# Patient Record
Sex: Female | Born: 1990
Health system: Southern US, Community
[De-identification: ages and names within clinical notes are randomized; demographics above are authoritative.]

## PROBLEM LIST (undated history)

## (undated) DIAGNOSIS — L309 Dermatitis, unspecified: Secondary | ICD-10-CM

## (undated) DIAGNOSIS — F419 Anxiety disorder, unspecified: Secondary | ICD-10-CM

## (undated) DIAGNOSIS — J309 Allergic rhinitis, unspecified: Secondary | ICD-10-CM

## (undated) DIAGNOSIS — F32A Depression, unspecified: Secondary | ICD-10-CM

## (undated) DIAGNOSIS — F329 Major depressive disorder, single episode, unspecified: Secondary | ICD-10-CM

## (undated) HISTORY — DX: Depression, unspecified: F32.A

## (undated) HISTORY — DX: Allergic rhinitis, unspecified: J30.9

## (undated) HISTORY — PX: TONSILLECTOMY AND ADENOIDECTOMY: SUR1326

## (undated) HISTORY — DX: Dermatitis, unspecified: L30.9

## (undated) HISTORY — DX: Anxiety disorder, unspecified: F41.9

## (undated) HISTORY — DX: Major depressive disorder, single episode, unspecified: F32.9

---
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
## 2013-05-25 ENCOUNTER — Emergency Department (HOSPITAL_COMMUNITY)
Admission: EM | Admit: 2013-05-25 | Discharge: 2013-05-25 | Disposition: A | Discharge status: Home / Self Care | Payer: No Typology Code available for payment source | Attending: Emergency Medicine | Admitting: Emergency Medicine

## 2013-05-25 ENCOUNTER — Emergency Department (HOSPITAL_COMMUNITY): Payer: No Typology Code available for payment source

## 2013-05-25 ENCOUNTER — Encounter (HOSPITAL_COMMUNITY): Payer: Self-pay | Admitting: Emergency Medicine

## 2013-05-25 DIAGNOSIS — Z3202 Encounter for pregnancy test, result negative: Secondary | ICD-10-CM | POA: Insufficient documentation

## 2013-05-25 DIAGNOSIS — Y92410 Unspecified street and highway as the place of occurrence of the external cause: Secondary | ICD-10-CM | POA: Insufficient documentation

## 2013-05-25 DIAGNOSIS — S0993XA Unspecified injury of face, initial encounter: Secondary | ICD-10-CM | POA: Insufficient documentation

## 2013-05-25 DIAGNOSIS — Z79899 Other long term (current) drug therapy: Secondary | ICD-10-CM | POA: Insufficient documentation

## 2013-05-25 DIAGNOSIS — Y9389 Activity, other specified: Secondary | ICD-10-CM | POA: Insufficient documentation

## 2013-05-25 DIAGNOSIS — IMO0002 Reserved for concepts with insufficient information to code with codable children: Secondary | ICD-10-CM | POA: Insufficient documentation

## 2013-05-25 DIAGNOSIS — S0990XA Unspecified injury of head, initial encounter: Secondary | ICD-10-CM | POA: Insufficient documentation

## 2013-05-25 LAB — POCT PREGNANCY, URINE: Preg Test, Ur: NEGATIVE

## 2013-05-25 MED ORDER — ONDANSETRON 4 MG PO TBDP
8.0000 mg | ORAL_TABLET | Freq: Once | ORAL | Status: AC
  Administered 2013-05-25: 8 mg via ORAL
  Filled 2013-05-25: qty 2

## 2013-05-25 MED ORDER — DIAZEPAM 5 MG PO TABS: 5.0000 mg | ORAL_TABLET | Freq: Two times a day (BID) | ORAL | Status: AC

## 2013-05-25 MED ORDER — HYDROCODONE-ACETAMINOPHEN 5-325 MG PO TABS: 1.0000 | ORAL_TABLET | Freq: Four times a day (QID) | ORAL | Status: DC | PRN

## 2013-05-25 MED ORDER — HYDROMORPHONE HCL PF 1 MG/ML IJ SOLN
1.0000 mg | Freq: Once | INTRAMUSCULAR | Status: AC
  Administered 2013-05-25: 1 mg via INTRAMUSCULAR
  Filled 2013-05-25: qty 1

## 2013-05-25 MED ORDER — CYCLOBENZAPRINE HCL 10 MG PO TABS
5.0000 mg | ORAL_TABLET | Freq: Once | ORAL | Status: AC
  Administered 2013-05-25: 5 mg via ORAL
  Filled 2013-05-25: qty 1

## 2013-05-25 MED ORDER — OXYCODONE-ACETAMINOPHEN 5-325 MG PO TABS
2.0000 | ORAL_TABLET | Freq: Once | ORAL | Status: AC
  Administered 2013-05-25: 2 via ORAL
  Filled 2013-05-25: qty 2
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
## 2013-05-25 NOTE — ED Notes (Signed)
Patient transported to X-ray 
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
## 2013-05-25 NOTE — ED Provider Notes (Signed)
5:19 PM Patient's CT scans were unremarkable. On repeat exam the patient is awake alert, speaking clearly, and in no distress.  I discussed return precautions, follow up instructions, pain management instructions with the patient.  Gerhard Munch, MD 05/25/13 [phone]

## 2013-05-25 NOTE — ED Notes (Signed)
Pt notified boyfriend

## 2013-11-22 ENCOUNTER — Encounter: Payer: Self-pay | Admitting: Obstetrics and Gynecology

## 2014-01-07 ENCOUNTER — Encounter: Payer: Self-pay | Admitting: Obstetrics and Gynecology

## 2014-01-07 ENCOUNTER — Telehealth: Payer: Self-pay | Admitting: Obstetrics and Gynecology

## 2014-01-07 NOTE — Telephone Encounter (Signed)
Okay thanks. You can close encounter.

## 2014-01-07 NOTE — Telephone Encounter (Signed)
Thanks for the update.  Patient can be seen only if she pays the [facility] no show fee for a new patient.

## 2014-01-07 NOTE — Telephone Encounter (Signed)
I called and confirmed appt with patient on 12/30/13. But patient DNKA this morning. I discharged patient.

## 2014-02-09 ENCOUNTER — Encounter: Payer: Self-pay | Admitting: Obstetrics and Gynecology

## 2014-12-26 ENCOUNTER — Ambulatory Visit: Payer: Self-pay | Admitting: Surgery

## 2014-12-26 NOTE — H&P (Signed)
History of Present Illness Maria Pennington. Caden Fatica MD; 12/26/2014 1:21 PM) Patient words: lipoma abd, LUQ.  The patient is a 24 year old female who presents with a complaint of Mass. Referred by Dr. Gust Brooms for evaluation of left chest wall mass.  This is a healthy 24 year old female who presents with several months of an enlarging mass below her left breast. This has become fairly prominent and asymmetric compared to the right. There is some mild discomfort associated with this area. There is no sign of any infection or inflammation in this area. She has not had any imaging of this mass. She is now referred for surgical evaluation for excision.   Other Problems (Ammie Eversole, LPN; 0/45/4098 11:91 AM) Anxiety Disorder Depression Migraine Headache  Past Surgical History (Ammie Eversole, LPN; 4/78/2956 21:30 AM) Knee Surgery Right. Tonsillectomy  Diagnostic Studies History (Ammie Eversole, LPN; 8/65/7846 96:29 AM) Colonoscopy never Mammogram >3 years ago Pap Smear 1-5 years ago  Allergies (Ammie Eversole, LPN; 02/11/4131 44:01 AM) No Known Drug Allergies04/07/2015  Medication History (Ammie Eversole, LPN; 0/27/2536 64:40 AM) Topiramate (  Tablet, Oral) Active. Phentermine HCl (  Capsule, Oral) Active. Relpax (  Tablet, Oral) Active.  Social History (Ammie Eversole, LPN; 3/47/4259 56:38 AM) Alcohol use Occasional alcohol use. Caffeine use Tea. No drug use Tobacco use Never smoker.  Family History Deon Pilling, LPN; 7/56/4332 95:18 AM) Depression Mother. Hypertension Mother. Migraine Headache Mother.  Pregnancy / Birth History Deon Pilling, LPN; 8/41/6606 30:16 AM) Age at menarche 17 years. Contraceptive History Intrauterine device. Gravida 0 Irregular periods Para 0  Review of Systems (Ammie Eversole LPN; 0/06/9322 55:73 AM) General Present- Weight Gain. Not Present- Appetite Loss, Chills, Fatigue, Fever, Night Sweats and Weight  Loss. Skin Not Present- Change in Wart/Mole, Dryness, Hives, Jaundice, New Lesions, Non-Healing Wounds, Rash and Ulcer. HEENT Present- Seasonal Allergies. Not Present- Earache, Hearing Loss, Hoarseness, Nose Bleed, Oral Ulcers, Ringing in the Ears, Sinus Pain, Sore Throat, Visual Disturbances, Wears glasses/contact lenses and Yellow Eyes. Respiratory Not Present- Bloody sputum, Chronic Cough, Difficulty Breathing, Snoring and Wheezing. Breast Present- Breast Pain. Not Present- Breast Mass, Nipple Discharge and Skin Changes. Cardiovascular Present- Swelling of Extremities. Not Present- Chest Pain, Difficulty Breathing Lying Down, Leg Cramps, Palpitations, Rapid Heart Rate and Shortness of Breath. Gastrointestinal Present- Constipation and Indigestion. Not Present- Abdominal Pain, Bloating, Bloody Stool, Change in Bowel Habits, Chronic diarrhea, Difficulty Swallowing, Excessive gas, Gets full quickly at meals, Hemorrhoids, Nausea, Rectal Pain and Vomiting. Female Genitourinary Not Present- Frequency, Nocturia, Painful Urination, Pelvic Pain and Urgency. Musculoskeletal Present- Back Pain. Not Present- Joint Pain, Joint Stiffness, Muscle Pain, Muscle Weakness and Swelling of Extremities. Neurological Present- Tingling. Not Present- Decreased Memory, Fainting, Headaches, Numbness, Seizures, Tremor, Trouble walking and Weakness. Psychiatric Not Present- Anxiety, Bipolar, Change in Sleep Pattern, Depression, Fearful and Frequent crying. Endocrine Not Present- Cold Intolerance, Excessive Hunger, Hair Changes, Heat Intolerance, Hot flashes and New Diabetes. Hematology Not Present- Easy Bruising, Excessive bleeding, Gland problems, HIV and Persistent Infections.   Vitals (Ammie Eversole LPN; 11/05/2540 70:62 AM) 12/26/2014 11:05 AM Weight: 243.8 lb Height: 68in Body Surface Area: 2.3 m Body Mass Index: 37.07 kg/m Temp.: 98.39F(Oral)  Pulse: 90 (Regular)  BP: 140/86 (Sitting, Left Arm,  Standard)    Physical Exam Molli Hazard K. Bev Drennen MD; 12/26/2014 1:22 PM) The physical exam findings are as follows: Note:WDWN in NAD Left chest wall below breast - 10 x 15 oval-shaped well-demarcated subcutaneous mass; soft, mildly tender; no sign of infection    Assessment & Plan Molli Hazard K.  Ted Leonhart MD; 12/26/2014 11:31 AM) LIPOMA OF ANTERIOR CHEST WALL (214.8  D17.1) Current Plans  Schedule for Surgery - excision of subcutaneous lipoma - left chest wall (10 x 15 cm). The surgical procedure has been discussed with the patient. Potential risks, benefits, alternative treatments, and expected outcomes have been explained. All of the patient's questions at this time have been answered. The likelihood of reaching the patient's treatment goal is good. The patient understand the proposed surgical procedure and wishes to proceed.    Maria ArmsMatthew K. Corliss Skainssuei, MD, [facility]  General/ Trauma Surgery  12/26/2014 1:23 PM

## 2015-08-16 ENCOUNTER — Telehealth: Payer: Self-pay

## 2015-08-16 NOTE — Telephone Encounter (Signed)
Pt called in today: c/o of stress due to work related issues. Stress from home life is causing some sx of nervous twitch of eye, worry and loss of sleep.  Please advise?

## 2015-08-16 NOTE — Telephone Encounter (Signed)
Called pt back and confirmed that there are no Suicidal or Homicidal ideations.  Confirmed pt has received her sample.

## 2015-08-16 NOTE — Telephone Encounter (Signed)
She has no SI or HI ideations, right? Will start Viibryd, sample pak was given

## 2015-10-10 LAB — LIPID PANEL
Cholesterol: 132 mg/dL (ref 0–200)
HDL: 49 mg/dL (ref 35–70)
LDL Cholesterol: 71 mg/dL
TRIGLYCERIDES: 61 mg/dL (ref 40–160)

## 2015-10-10 LAB — HM PAP SMEAR

## 2015-10-10 LAB — TSH: TSH: 0.51 u[IU]/mL (ref 0.41–5.90)

## 2015-10-23 ENCOUNTER — Telehealth: Payer: Self-pay | Admitting: Geriatric Medicine

## 2015-10-23 ENCOUNTER — Other Ambulatory Visit: Payer: Self-pay | Admitting: Internal Medicine

## 2015-10-23 DIAGNOSIS — R8290 Unspecified abnormal findings in urine: Secondary | ICD-10-CM

## 2015-10-23 DIAGNOSIS — B9689 Other specified bacterial agents as the cause of diseases classified elsewhere: Secondary | ICD-10-CM | POA: Insufficient documentation

## 2015-10-23 DIAGNOSIS — N760 Acute vaginitis: Secondary | ICD-10-CM

## 2015-10-23 LAB — POCT URINALYSIS DIPSTICK
Bilirubin, UA: NEGATIVE
Blood, UA: NEGATIVE
GLUCOSE UA: NEGATIVE
Ketones, UA: NEGATIVE
LEUKOCYTES UA: NEGATIVE
NITRITE UA: NEGATIVE
Protein, UA: 15
Spec Grav, UA: >=1.03
UROBILINOGEN UA: 0.2
pH, UA: 6

## 2015-10-23 MED ORDER — METRONIDAZOLE 250 MG PO TABS
250.0000 mg | ORAL_TABLET | Freq: Two times a day (BID) | ORAL | Status: DC
Start: 2015-10-23 — End: 2015-10-24

## 2015-10-23 NOTE — Telephone Encounter (Signed)
Rx sent in

## 2015-10-23 NOTE — Telephone Encounter (Signed)
Pt c/o foul smelling urine, discharge, hx of BV

## 2015-10-24 ENCOUNTER — Other Ambulatory Visit: Payer: Self-pay

## 2015-10-24 DIAGNOSIS — B9689 Other specified bacterial agents as the cause of diseases classified elsewhere: Secondary | ICD-10-CM

## 2015-10-24 DIAGNOSIS — N760 Acute vaginitis: Secondary | ICD-10-CM

## 2015-10-24 MED ORDER — METRONIDAZOLE 250 MG PO TABS: 250.0000 mg | ORAL_TABLET | Freq: Two times a day (BID) | ORAL | Status: AC

## 2015-11-21 ENCOUNTER — Other Ambulatory Visit: Payer: Self-pay | Admitting: Internal Medicine

## 2015-11-21 MED ORDER — FLUCONAZOLE 150 MG PO TABS: 150.0000 mg | ORAL_TABLET | Freq: Once | ORAL | Status: DC

## 2015-12-27 ENCOUNTER — Encounter: Payer: Self-pay | Admitting: Internal Medicine

## 2015-12-27 ENCOUNTER — Other Ambulatory Visit (INDEPENDENT_AMBULATORY_CARE_PROVIDER_SITE_OTHER): Payer: BLUE CROSS/BLUE SHIELD

## 2015-12-27 ENCOUNTER — Ambulatory Visit (INDEPENDENT_AMBULATORY_CARE_PROVIDER_SITE_OTHER): Payer: BLUE CROSS/BLUE SHIELD | Admitting: Internal Medicine

## 2015-12-27 VITALS — BP 124/78 | HR 93 | Temp 98.3°F | Resp 16 | Ht 69.0 in | Wt 213.0 lb

## 2015-12-27 DIAGNOSIS — T753XXA Motion sickness, initial encounter: Secondary | ICD-10-CM | POA: Diagnosis not present

## 2015-12-27 DIAGNOSIS — R946 Abnormal results of thyroid function studies: Secondary | ICD-10-CM | POA: Diagnosis not present

## 2015-12-27 DIAGNOSIS — R238 Other skin changes: Secondary | ICD-10-CM

## 2015-12-27 DIAGNOSIS — J301 Allergic rhinitis due to pollen: Secondary | ICD-10-CM

## 2015-12-27 DIAGNOSIS — R7989 Other specified abnormal findings of blood chemistry: Secondary | ICD-10-CM

## 2015-12-27 DIAGNOSIS — R233 Spontaneous ecchymoses: Secondary | ICD-10-CM

## 2015-12-27 LAB — CBC WITH DIFFERENTIAL/PLATELET
BASOS PCT: 0.3 % (ref 0.0–3.0)
Basophils Absolute: 0 10*3/uL (ref 0.0–0.1)
EOS PCT: 1.8 % (ref 0.0–5.0)
Eosinophils Absolute: 0.2 10*3/uL (ref 0.0–0.7)
HCT: 39.9 % (ref 36.0–46.0)
Hemoglobin: 13.4 g/dL (ref 12.0–15.0)
LYMPHS ABS: 2.5 10*3/uL (ref 0.7–4.0)
Lymphocytes Relative: 26.6 % (ref 12.0–46.0)
MCHC: 33.7 g/dL (ref 30.0–36.0)
MCV: 86.4 fl (ref 78.0–100.0)
MONO ABS: 0.7 10*3/uL (ref 0.1–1.0)
Monocytes Relative: 7.2 % (ref 3.0–12.0)
NEUTROS ABS: 6.1 10*3/uL (ref 1.4–7.7)
NEUTROS PCT: 64.1 % (ref 43.0–77.0)
PLATELETS: 285 10*3/uL (ref 150.0–400.0)
RBC: 4.62 Mil/uL (ref 3.87–5.11)
RDW: 13.4 % (ref 11.5–15.5)
WBC: 9.5 10*3/uL (ref 4.0–10.5)

## 2015-12-27 LAB — APTT: aPTT: 29.5 s (ref 23.4–32.7)

## 2015-12-27 LAB — COMPREHENSIVE METABOLIC PANEL
ALBUMIN: 4.5 g/dL (ref 3.5–5.2)
ALT: 11 U/L (ref 0–35)
AST: 14 U/L (ref 0–37)
Alkaline Phosphatase: 39 U/L (ref 39–117)
BUN: 15 mg/dL (ref 6–23)
CHLORIDE: 108 meq/L (ref 96–112)
CO2: 20 mEq/L (ref 19–32)
Calcium: 9.6 mg/dL (ref 8.4–10.5)
Creatinine, Ser: 0.74 mg/dL (ref 0.40–1.20)
GFR: 102.68 mL/min (ref >60.00)
Glucose, Bld: 83 mg/dL (ref 70–99)
Potassium: 3.5 mEq/L (ref 3.5–5.1)
SODIUM: 136 meq/L (ref 135–145)
TOTAL PROTEIN: 7.5 g/dL (ref 6.0–8.3)
Total Bilirubin: 0.4 mg/dL (ref 0.2–1.2)

## 2015-12-27 LAB — T4, FREE: FREE T4: 0.9 ng/dL (ref 0.60–1.60)

## 2015-12-27 LAB — PROTIME-INR
INR: 1.2 ratio — AB (ref 0.8–1.0)
Prothrombin Time: 12.7 s (ref 9.6–13.1)

## 2015-12-27 LAB — TSH: TSH: 1.53 u[IU]/mL (ref 0.35–4.50)

## 2015-12-27 MED ORDER — MOMETASONE FUROATE 50 MCG/ACT NA SUSP: 2.0000 | Freq: Every day | NASAL | Status: DC

## 2015-12-27 MED ORDER — LEVOCETIRIZINE DIHYDROCHLORIDE 5 MG PO TABS: 5.0000 mg | ORAL_TABLET | Freq: Every evening | ORAL | Status: DC

## 2015-12-27 MED ORDER — SCOPOLAMINE 1 MG/3DAYS TD PT72: 1.0000 | MEDICATED_PATCH | TRANSDERMAL | Status: DC

## 2015-12-27 NOTE — Progress Notes (Signed)
Pre visit review using our clinic review tool, if applicable. No additional management support is needed unless otherwise documented below in the visit note. 

## 2015-12-27 NOTE — Patient Instructions (Signed)
Allergic Rhinitis Allergic rhinitis is when the mucous membranes in the nose respond to allergens. Allergens are particles in the air that cause your body to have an allergic reaction. This causes you to release allergic antibodies. Through a chain of events, these eventually cause you to release histamine into the blood stream. Although meant to protect the body, it is this release of histamine that causes your discomfort, such as frequent sneezing, congestion, and an itchy, runny nose.  CAUSES Seasonal allergic rhinitis (hay fever) is caused by pollen allergens that may come from grasses, trees, and weeds. Year-round allergic rhinitis (perennial allergic rhinitis) is caused by allergens such as house dust mites, pet dander, and mold spores. SYMPTOMS  Nasal stuffiness (congestion).  Itchy, runny nose with sneezing and tearing of the eyes. DIAGNOSIS Your health care provider can help you determine the allergen or allergens that trigger your symptoms. If you and your health care provider are unable to determine the allergen, skin or blood testing may be used. Your health care provider will diagnose your condition after taking your health history and performing a physical exam. Your health care provider may assess you for other related conditions, such as asthma, pink eye, or an ear infection. TREATMENT Allergic rhinitis does not have a cure, but it can be controlled by:  Medicines that block allergy symptoms. These may include allergy shots, nasal sprays, and oral antihistamines.  Avoiding the allergen. Hay fever may often be treated with antihistamines in pill or nasal spray forms. Antihistamines block the effects of histamine. There are over-the-counter medicines that may help with nasal congestion and swelling around the eyes. Check with your health care provider before taking or giving this medicine. If avoiding the allergen or the medicine prescribed do not work, there are many new medicines  your health care provider can prescribe. Stronger medicine may be used if initial measures are ineffective. Desensitizing injections can be used if medicine and avoidance does not work. Desensitization is when a patient is given ongoing shots until the body becomes less sensitive to the allergen. Make sure you follow up with your health care provider if problems continue. HOME CARE INSTRUCTIONS It is not possible to completely avoid allergens, but you can reduce your symptoms by taking steps to limit your exposure to them. It helps to know exactly what you are allergic to so that you can avoid your specific triggers. SEEK MEDICAL CARE IF:  You have a fever.  You develop a cough that does not stop easily (persistent).  You have shortness of breath.  You start wheezing.  Symptoms interfere with normal daily activities.   This information is not intended to replace advice given to you by your health care provider. Make sure you discuss any questions you have with your health care provider.   Document Released: 05/28/2001 Document Revised: 09/23/2014 Document Reviewed: 05/10/2013 Elsevier Interactive Patient Education 2016 Elsevier Inc.  

## 2015-12-28 ENCOUNTER — Other Ambulatory Visit: Payer: Self-pay

## 2015-12-28 ENCOUNTER — Encounter: Payer: Self-pay | Admitting: Internal Medicine

## 2015-12-28 LAB — T3: T3, Total: 100 ng/dL (ref 76–181)

## 2015-12-28 LAB — THYROID PEROXIDASE ANTIBODY: Thyroperoxidase Ab SerPl-aCnc: 11 IU/mL — ABNORMAL HIGH (ref <9)

## 2015-12-28 MED ORDER — FLUTICASONE PROPIONATE 50 MCG/ACT NA SUSP: 2.0000 | Freq: Every day | NASAL | Status: DC

## 2015-12-28 MED ORDER — MECLIZINE HCL 12.5 MG PO TABS: 12.5000 mg | ORAL_TABLET | Freq: Three times a day (TID) | ORAL | Status: DC | PRN

## 2015-12-28 NOTE — Telephone Encounter (Signed)
Motion sickness patch was too expensive: can an alternative be sent in? Nasal spray is not covered by insurance but fluticasone is covered: can you send in a 90 day supply?

## 2015-12-31 NOTE — Progress Notes (Signed)
Subjective:  Patient ID: Maria Pennington, female    DOB: 11/20/1990  Age: 25 y.o. MRN: [id number]  CC: Allergic Rhinitis    HPI Maria Pennington presents for several concerns -  She complains that she has noticed a couple of bruises on her right arm and her right lower extremity over the past few weeks. She does not recall any injury that would cause a bruise. She said the bruises last a few days and then spontaneously resolve. She denies nosebleeds, bleeding from her mouth or gums, hematuria, heavy menstrual bleeding, or prior complications from surgical procedures. There are no significant bruises for her to show me today.  She complains that when she travels as a passenger in a car she gets car sick and request a medication for this.  She also complains of springtime nasal allergies with congestion, postnasal drip, and sneezing. She wants a refill on Xyzal and to start a steroid nasal spray.  She has a history of low TSH level that needs to be followed.  History Maria Pennington has no past medical history on file.   She has past surgical history that includes Knee surgery (Right, 2009).   Her family history is not on file.She reports that she has never smoked. She does not have any smokeless tobacco history on file. She reports that she does not drink alcohol or use illicit drugs.  Outpatient Prescriptions Prior to Visit  Medication Sig Dispense Refill  . fluconazole (DIFLUCAN) 150 MG tablet Take 1 tablet (150 mg total) by mouth once. 1 tablet 3   No facility-administered medications prior to visit.    ROS Review of Systems  Constitutional: Negative.  Negative for fever, chills and fatigue.  HENT: Positive for postnasal drip, rhinorrhea and sneezing. Negative for nosebleeds, sinus pressure, sore throat and trouble swallowing.   Eyes: Negative.  Negative for photophobia and visual disturbance.  Respiratory: Negative.  Negative for cough and shortness of breath.   Cardiovascular:  Negative.  Negative for chest pain, palpitations and leg swelling.  Gastrointestinal: Negative.  Negative for nausea, vomiting, abdominal pain, diarrhea, constipation and blood in stool.  Endocrine: Negative.   Genitourinary: Negative.  Negative for hematuria, vaginal bleeding and vaginal discharge.  Musculoskeletal: Negative.  Negative for myalgias, back pain and neck pain.  Skin: Negative.  Negative for color change, pallor and rash.  Allergic/Immunologic: Negative.   Neurological: Negative.   Hematological: Negative for adenopathy. Bruises/bleeds easily.  Psychiatric/Behavioral: Positive for dysphoric mood. Negative for suicidal ideas, hallucinations, behavioral problems, confusion, sleep disturbance, self-injury, decreased concentration and agitation. The patient is nervous/anxious. The patient is not hyperactive.        She is being treated elsewhere for depression and anxiety.    Objective:  BP 124/78 mmHg  Pulse 93  Temp(Src) 98.3 F (36.8 C) (Oral)  Resp 16  Ht 5\' 9"  (1.753 m)  Wt 213 lb (96.616 kg)  BMI 31.44 kg/m2  SpO2 98%  Physical Exam  Constitutional: No distress.  HENT:  Mouth/Throat: Oropharynx is clear and moist. No oropharyngeal exudate.  Eyes: Conjunctivae are normal. Right eye exhibits no discharge. Left eye exhibits no discharge. No scleral icterus.  Neck: Normal range of motion. Neck supple. No JVD present. No tracheal deviation present. No thyromegaly present.  Cardiovascular: Normal rate, regular rhythm, normal heart sounds and intact distal pulses.  Exam reveals no gallop and no friction rub.   No murmur heard. Pulmonary/Chest: Effort normal and breath sounds normal. No stridor. No respiratory distress. She has no wheezes.  She has no rales. She exhibits no tenderness.  Abdominal: Soft. Bowel sounds are normal. She exhibits no distension and no mass. There is no tenderness. There is no rebound and no guarding.  Musculoskeletal: Normal range of motion. She  exhibits no edema or tenderness.  Lymphadenopathy:    She has no cervical adenopathy.  Skin: Skin is warm, dry and intact. No abrasion, no bruising, no ecchymosis, no petechiae and no rash noted. She is not diaphoretic. No erythema. No pallor.  Psychiatric: She has a normal mood and affect. Her behavior is normal. Judgment and thought content normal.  Vitals reviewed.     Assessment & Plan:   Tais was seen today for allergic rhinitis .  Diagnoses and all orders for this visit:  Low TSH level- her thyroid peroxidase antibody is slightly positive, her TFTs are currently normal, I will monitor her thyroid function periodically for the development of hypothyroidism. -     T3; Future -     T4, free; Future -     TSH; Future -     Thyroid peroxidase antibody; Future -     Comprehensive metabolic panel; Future -     CBC with Differential/Platelet; Future  Easy bruising- there is no evidence of bruising or bleeding at this time, her labs are all within normal limits-normal platelet count normal pro time and normal PTT. She will avoid alcohol and NSAIDs and will notify me if she develops any new or worsening symptoms. -     Comprehensive metabolic panel; Future -     CBC with Differential/Platelet; Future -     Protime-INR; Future -     APTT; Future  Allergic rhinitis due to pollen -     levocetirizine (XYZAL) 5 MG tablet; Take 1 tablet (5 mg total) by mouth every evening. -     Discontinue: mometasone (NASONEX) 50 MCG/ACT nasal spray; Place 2 sprays into the nose daily.  Motion sickness, initial encounter -     Discontinue: scopolamine (TRANSDERM-SCOP, 1.5 MG,) 1 MG/3DAYS; Place 1 patch (1.5 mg total) onto the skin every 3 (three) days. -     meclizine (ANTIVERT) 12.5 MG tablet; Take 1 tablet (12.5 mg total) by mouth 3 (three) times daily as needed for dizziness.   I have discontinued Maria Pennington's fluconazole, mometasone, and scopolamine. I am also having her start on  levocetirizine and meclizine. Additionally, I am having her maintain her clonazePAM, FLUoxetine, topiramate, Diclofenac Sodium, and Levonorgestrel.  Meds ordered this encounter  Medications  . clonazePAM (KLONOPIN) 0.5 MG tablet    Sig: TAKE 1 TABLET ONCE DAILY AS NEEDED.    Refill:  0  . FLUoxetine (PROZAC) 10 MG capsule    Sig: Take 10 mg by mouth every morning.    Refill:  0  . topiramate (TOPAMAX) 100 MG tablet    Sig: Take 100 mg by mouth 2 (two) times daily.  . Diclofenac Sodium (PENNSAID) 2 % SOLN    Sig: Place onto the skin.  Marland Kitchen levocetirizine (XYZAL) 5 MG tablet    Sig: Take 1 tablet (5 mg total) by mouth every evening.    Dispense:  30 tablet    Refill:  11  . DISCONTD: mometasone (NASONEX) 50 MCG/ACT nasal spray    Sig: Place 2 sprays into the nose daily.    Dispense:  17 g    Refill:  11  . DISCONTD: scopolamine (TRANSDERM-SCOP, 1.5 MG,) 1 MG/3DAYS    Sig: Place 1 patch (1.5 mg total)  onto the skin every 3 (three) days.    Dispense:  10 patch    Refill:  1  . Levonorgestrel (SKYLA) 13.5 MG IUD    Sig: 1 Act by Intrauterine route once.    Dispense:  1 Intra Uterine Device    Refill:  0  . meclizine (ANTIVERT) 12.5 MG tablet    Sig: Take 1 tablet (12.5 mg total) by mouth 3 (three) times daily as needed for dizziness.    Dispense:  30 tablet    Refill:  1     Follow-up: Return if symptoms worsen or fail to improve.  Sanda Linger, MD

## 2016-01-18 ENCOUNTER — Telehealth: Payer: Self-pay | Admitting: Geriatric Medicine

## 2016-01-18 DIAGNOSIS — N760 Acute vaginitis: Secondary | ICD-10-CM

## 2016-01-18 DIAGNOSIS — B9689 Other specified bacterial agents as the cause of diseases classified elsewhere: Secondary | ICD-10-CM

## 2016-01-18 MED ORDER — METRONIDAZOLE 500 MG PO TABS: 500.0000 mg | ORAL_TABLET | Freq: Two times a day (BID) | ORAL | Status: DC

## 2016-01-18 NOTE — Telephone Encounter (Signed)
Rx sent 

## 2016-01-18 NOTE — Telephone Encounter (Signed)
Patient aware.

## 2016-01-18 NOTE — Telephone Encounter (Signed)
Patient complains of watery discharge with mild ammonia smell. Please advise, thanks. [facility] [address]

## 2016-01-29 ENCOUNTER — Other Ambulatory Visit: Payer: Self-pay | Admitting: Geriatric Medicine

## 2016-01-29 DIAGNOSIS — J301 Allergic rhinitis due to pollen: Secondary | ICD-10-CM

## 2016-01-29 MED ORDER — LEVOCETIRIZINE DIHYDROCHLORIDE 5 MG PO TABS: 5.0000 mg | ORAL_TABLET | Freq: Every evening | ORAL | Status: DC

## 2016-03-12 ENCOUNTER — Other Ambulatory Visit: Payer: Self-pay | Admitting: Family

## 2016-03-12 MED ORDER — SULFAMETHOXAZOLE-TRIMETHOPRIM 800-160 MG PO TABS: 1.0000 | ORAL_TABLET | Freq: Two times a day (BID) | ORAL | Status: DC

## 2016-03-28 ENCOUNTER — Telehealth: Payer: Self-pay | Admitting: Internal Medicine

## 2016-03-28 ENCOUNTER — Other Ambulatory Visit: Payer: Self-pay | Admitting: Internal Medicine

## 2016-03-28 DIAGNOSIS — R1310 Dysphagia, unspecified: Secondary | ICD-10-CM

## 2016-03-28 DIAGNOSIS — K219 Gastro-esophageal reflux disease without esophagitis: Secondary | ICD-10-CM | POA: Insufficient documentation

## 2016-03-28 NOTE — Telephone Encounter (Signed)
Requesting referral to GI.  Has had difficulty swallowing when eating food.  Has hiccups with sore throat as well.  Feels like something is caught in middle of throat. Would like to know if she needs to make OV or if she could just get referral.

## 2016-03-28 NOTE — Telephone Encounter (Signed)
Referral ordered

## 2016-03-29 ENCOUNTER — Telehealth: Payer: Self-pay | Admitting: Gastroenterology

## 2016-03-29 ENCOUNTER — Encounter: Payer: Self-pay | Admitting: Gastroenterology

## 2016-03-29 NOTE — Telephone Encounter (Signed)
Appointment moved to 04/02/16

## 2016-03-29 NOTE — Telephone Encounter (Signed)
Notified patient.  Maria Pennington and Maria Pennington can you make sure this gets sent.  Thanks!

## 2016-03-29 NOTE — Telephone Encounter (Signed)
Referral sent to [facility]

## 2016-03-31 ENCOUNTER — Encounter: Payer: Self-pay | Admitting: Internal Medicine

## 2016-04-02 ENCOUNTER — Encounter: Payer: Self-pay | Admitting: Gastroenterology

## 2016-04-02 ENCOUNTER — Ambulatory Visit (INDEPENDENT_AMBULATORY_CARE_PROVIDER_SITE_OTHER): Payer: BLUE CROSS/BLUE SHIELD | Admitting: Gastroenterology

## 2016-04-02 VITALS — BP 116/68 | HR 72 | Ht 69.0 in | Wt 209.8 lb

## 2016-04-02 DIAGNOSIS — R1310 Dysphagia, unspecified: Secondary | ICD-10-CM | POA: Diagnosis not present

## 2016-04-02 MED ORDER — OMEPRAZOLE 40 MG PO CPDR: 40.0000 mg | DELAYED_RELEASE_CAPSULE | Freq: Every day | ORAL | Status: DC

## 2016-04-02 NOTE — Progress Notes (Signed)
Maria Pennington    [id number]    [birth year]  Primary Care Physician:Maria Yetta Barre, MD  Referring Physician: [name], MD [address] [place], [place] [address]  Chief complaint:  Dysphagia   HPI: 25 year old female here for evaluation of dysphagia to mostly solids for past week and half. She reports her symptoms started all of a sudden after she choked about 10 days ago during a meal, doesn't remember exactly what she was eating but thinks was some kind of meat and since then has had 1 more episode of choking with spaghetti and meatball where she had to regurgitate the food back. She feels food is getting stuck in the back of her throat and at times she can feel the pills hanging up for a little bit longer before they go down. She hasn't had any issue prior to these choking episodes with swallowing. She has only intermittent heartburn and regurgitation and was taking antacids as needed. No significant weight change, denies any weight gain or loss in the past few months. Denies any nausea, vomiting, abdominal pain, melena or bright red blood per rectum. No history of asthma, Atopy or environmental allergy other than seasonal allergy that is better this year because of antihistamines . ROS positive for Mild diarrhea per patient, she had 1 episode of semi-formed stool yesterday and one episode this morning. No fever or chills. Denies eating uncooked raw meat or anything different, no sick contacts.      Outpatient Encounter Prescriptions as of 04/02/2016  Medication Sig  . clonazePAM (KLONOPIN) 0.5 MG tablet TAKE 1 TABLET ONCE DAILY AS NEEDED.  Marland Kitchen Diclofenac Sodium (PENNSAID) 2 % SOLN Place onto the skin.  Marland Kitchen FLUoxetine (PROZAC) 10 MG capsule Take 10 mg by mouth every morning.  Marland Kitchen levocetirizine (XYZAL) 5 MG tablet Take 1 tablet (5 mg total) by mouth every evening.  . Levonorgestrel (SKYLA) 13.5 MG IUD 1 Act by Intrauterine route once.  . topiramate (TOPAMAX) 100 MG  tablet Take 100 mg by mouth 2 (two) times daily.  Marland Kitchen omeprazole (PRILOSEC) 40 MG capsule Take 1 capsule (40 mg total) by mouth daily.  . [DISCONTINUED] fluticasone (FLONASE) 50 MCG/ACT nasal spray Place 2 sprays into both nostrils daily.  . [DISCONTINUED] meclizine (ANTIVERT) 12.5 MG tablet Take 1 tablet (12.5 mg total) by mouth 3 (three) times daily as needed for dizziness.  . [DISCONTINUED] metroNIDAZOLE (FLAGYL) 500 MG tablet Take 1 tablet (500 mg total) by mouth 2 (two) times daily.  . [DISCONTINUED] sulfamethoxazole-trimethoprim (BACTRIM DS,SEPTRA DS) 800-160 MG tablet Take 1 tablet by mouth 2 (two) times daily.   No facility-administered encounter medications on file as of 04/02/2016.    Allergies as of 04/02/2016 - Review Complete 04/02/2016  Allergen Reaction Noted  . Relpax [eletriptan] Palpitations 12/27/2015  . Triptans Palpitations 12/27/2015    Past Medical History  Diagnosis Date  . Anxiety   . Depressed   . Eczema   . Allergic rhinitis     Past Surgical History  Procedure Laterality Date  . Knee surgery Right 2009  . Tonsillectomy and adenoidectomy      Family History  Problem Relation Age of Onset  . Colon cancer Neg Hx   . Colonic polyp Paternal Grandmother   . Colonic polyp Mother   . Heart disease Maternal Grandmother   . Ulcers Father     Social History   Social History  . Marital Status: Single  Spouse Name: N/A  . Number of Children: 0  . Years of Education: N/A   Occupational History  . unemployed    Social History Main Topics  . Smoking status: Never Smoker   . Smokeless tobacco: Not on file  . Alcohol Use: No  . Drug Use: No  . Sexual Activity: Not on file   Other Topics Concern  . Not on file   Social History Narrative      Review of systems: Review of Systems  Constitutional: Negative for fever and chills.  HENT: Negative.   Eyes: Negative for blurred vision.  Respiratory: Negative for cough, shortness of breath and  wheezing.   Cardiovascular: Negative for chest pain and palpitations.  Gastrointestinal: as per HPI Genitourinary: Negative for dysuria, urgency, frequency and hematuria.  Musculoskeletal: Positive for myalgias, negative for back pain and joint pain.  Skin: Negative for itching and rash.  Neurological: Negative for dizziness, tremors, focal weakness, seizures and loss of consciousness.  Endo/Heme/Allergies: Positive for environmental allergies.  Psychiatric/Behavioral: Negative for depression, suicidal ideas and hallucinations.  All other systems reviewed and are negative.   Physical Exam: Filed Vitals:   04/02/16 0847  BP: 116/68  Pulse: 72   Gen:      No acute distress HEENT:  EOMI, sclera anicteric Neck:     No masses; no thyromegaly Lungs:    Clear to auscultation bilaterally; normal respiratory effort CV:         Regular rate and rhythm; no murmurs Abd:      + bowel sounds; soft, non-tender; no palpable masses, no distension Ext:    No edema; adequate peripheral perfusion Skin:      Warm and dry; no rash Neuro: alert and oriented x 3 Psych: normal mood and affect  Data Reviewed:  Reviewed chart in epic   Assessment and Plan/Recommendations:  25 year old female with acute onset solid dysphagia past 10 days, appears like she has had 2 episodes of food impaction that spontaneously resolved, did not require ER visit.  Differential includes eosinophilic esophagitis, peptic stricture, esophageal rings, esophageal dysmotility, achalasia. We'll schedule for EGD to evaluate and obtain esophageal biopsies Based on EGD findings we'll consider further workup is needed to evaluate esophageal function Advised patient to stay on soft pured diet until the procedure Start omeprazole 40 mg daily, 30 minutes before breakfast and follow antireflux measures  Greater than 50% of the time used for counseling as well as treatment plan and follow-up. She had multiple questions which were  answered to her satisfaction   K. Scherry RanVeena Kaedan Richert , MD [phone] Mon-Fri 8a-5p [phone] after 5p, weekends, holidays  CC: Maria GrandchildJones, Maria L, MD

## 2016-04-02 NOTE — Patient Instructions (Signed)
You have been scheduled for an endoscopy. Please follow written instructions given to you at your visit today. If you use inhalers (even only as needed), please bring them with you on the day of your procedure. Your physician has requested that you go to www.startemmi.com and enter the access code given to you at your visit today. This web site gives a general overview about your procedure. However, you should still follow specific instructions given to you by our office regarding your preparation for the procedure.  We will send omeprazole to your pharmacy

## 2016-04-03 ENCOUNTER — Ambulatory Visit (AMBULATORY_SURGERY_CENTER): Payer: BLUE CROSS/BLUE SHIELD | Admitting: Gastroenterology

## 2016-04-03 ENCOUNTER — Encounter: Payer: Self-pay | Admitting: Gastroenterology

## 2016-04-03 VITALS — BP 116/74 | HR 76 | Temp 98.6°F | Resp 20 | Ht 69.0 in | Wt 209.0 lb

## 2016-04-03 DIAGNOSIS — R1310 Dysphagia, unspecified: Secondary | ICD-10-CM | POA: Diagnosis not present

## 2016-04-03 MED ORDER — SODIUM CHLORIDE 0.9 % IV SOLN: 500.0000 mL | INTRAVENOUS | Status: DC

## 2016-04-03 NOTE — Progress Notes (Signed)
Stable to RR 

## 2016-04-03 NOTE — Op Note (Signed)
[facility] Patient Name: Maria Pennington Procedure Date: 04/03/2016 3:01 PM MRN: [id number] Endoscopist: Napoleon Form , MD Age: 25 Referring MD:  Date of Birth: 1991/03/05 Gender: Female Account #: [id number] Procedure:                Upper GI endoscopy Indications:              Dysphagia Medicines:                Monitored Anesthesia Care Procedure:                Pre-Anesthesia Assessment:                           - Prior to the procedure, a History and Physical                            was performed, and patient medications and                            allergies were reviewed. The patient's tolerance of                            previous anesthesia was also reviewed. The risks                            and benefits of the procedure and the sedation                            options and risks were discussed with the patient.                            All questions were answered, and informed consent                            was obtained. Prior Anticoagulants: The patient has                            taken no previous anticoagulant or antiplatelet                            agents. ASA Grade Assessment: II - A patient with                            mild systemic disease. After reviewing the risks                            and benefits, the patient was deemed in                            satisfactory condition to undergo the procedure.                           After obtaining informed consent, the endoscope was  passed under direct vision. Throughout the                            procedure, the patient's blood pressure, pulse, and                            oxygen saturations were monitored continuously. The                            Model GIF-HQ190 [id number]) scope was introduced                            through the mouth, and advanced to the second part                            of duodenum. The upper GI endoscopy  was                            accomplished without difficulty. The patient                            tolerated the procedure well. Scope In: Scope Out: Findings:                 The examined esophagus was normal. Biopsies were                            obtained from the proximal and distal esophagus                            with cold forceps for histology of suspected                            eosinophilic esophagitis.                           The stomach was normal.                           The examined duodenum was normal. Complications:            No immediate complications. Estimated Blood Loss:     Estimated blood loss was minimal. Impression:               - Normal esophagus. Biopsied.                           - Normal stomach.                           - Normal examined duodenum. Recommendation:           - Patient has a contact number available for                            emergencies. The signs and symptoms of potential  delayed complications were discussed with the                            patient. Return to normal activities tomorrow.                            Written discharge instructions were provided to the                            patient.                           - Resume previous diet.                           - Continue present medications.                           - Await pathology results.                           - Repeat upper endoscopy for surveillance based on                            pathology results.                           - Return to GI clinic PRN. Napoleon FormKavitha V. Henning Ehle, MD 04/03/2016 3:36:20 PM This report has been signed electronically.

## 2016-04-03 NOTE — Patient Instructions (Signed)
Biopsies taken today. Result letter in your mail in 2-3 weeks. Resume current medications. Follow up as needed. Call us with any questions or concerns. Thank you!!   YOU HAD AN ENDOSCOPIC PROCEDURE TODAY AT THE [facility]:   Refer to the procedure report that was given to you for any specific questions about what was found during the examination.  If the procedure report does not answer your questions, please call your gastroenterologist to clarify.  If you requested that your care partner not be given the details of your procedure findings, then the procedure report has been included in a sealed envelope for you to review at your convenience later.  YOU SHOULD EXPECT: Some feelings of bloating in the abdomen. Passage of more gas than usual.  Walking can help get rid of the air that was put into your GI tract during the procedure and reduce the bloating. If you had a lower endoscopy (such as a colonoscopy or flexible sigmoidoscopy) you may notice spotting of blood in your stool or on the toilet paper. If you underwent a bowel prep for your procedure, you may not have a normal bowel movement for a few days.  Please Note:  You might notice some irritation and congestion in your nose or some drainage.  This is from the oxygen used during your procedure.  There is no need for concern and it should clear up in a day or so.  SYMPTOMS TO REPORT IMMEDIATELY:   Following upper endoscopy (EGD)  Vomiting of blood or coffee ground material  New chest pain or pain under the shoulder blades  Painful or persistently difficult swallowing  New shortness of breath  Fever of 100F or higher  Black, tarry-looking stools  For urgent or emergent issues, a gastroenterologist can be reached at any hour by calling [phone].   DIET: Your first meal following the procedure should be a small meal and then it is ok to progress to your normal diet. Heavy or fried foods are harder to digest and may make  you feel nauseous or bloated.  Likewise, meals heavy in dairy and vegetables can increase bloating.  Drink plenty of fluids but you should avoid alcoholic beverages for 24 hours.  ACTIVITY:  You should plan to take it easy for the rest of today and you should NOT DRIVE or use heavy machinery until tomorrow (because of the sedation medicines used during the test).    FOLLOW UP: Our staff will call the number listed on your records the next business day following your procedure to check on you and address any questions or concerns that you may have regarding the information given to you following your procedure. If we do not reach you, we will leave a message.  However, if you are feeling well and you are not experiencing any problems, there is no need to return our call.  We will assume that you have returned to your regular daily activities without incident.  If any biopsies were taken you will be contacted by phone or by letter within the next 1-3 weeks.  Please call us at [phone] if you have not heard about the biopsies in 3 weeks.    SIGNATURES/CONFIDENTIALITY: You and/or your care partner have signed paperwork which will be entered into your electronic medical record.  These signatures attest to the fact that that the information above on your After Visit Summary has been reviewed and is understood.  Full responsibility of the confidentiality of this  discharge information lies with you and/or your care-partner. 

## 2016-04-03 NOTE — Progress Notes (Signed)
Called to room to assist during endoscopic procedure.  Patient ID and intended procedure confirmed with present staff. Received instructions for my participation in the procedure from the performing physician.  

## 2016-04-04 ENCOUNTER — Telehealth: Payer: Self-pay

## 2016-04-04 NOTE — Telephone Encounter (Signed)
  Follow up Call-  Call back number 04/03/2016  Post procedure Call Back phone  # [phone]  Permission to leave phone message Yes    Patient was called for follow up after her procedure on 04/03/2016. No answer at the number given for follow up phone call. Mail box was full so I was not able to leave a message.

## 2016-04-05 ENCOUNTER — Telehealth: Payer: Self-pay | Admitting: *Deleted

## 2016-04-05 MED ORDER — OMEPRAZOLE 40 MG PO CPDR: 40.0000 mg | DELAYED_RELEASE_CAPSULE | Freq: Every day | ORAL | Status: DC

## 2016-04-05 NOTE — Telephone Encounter (Signed)
90 day request from pharmacy for omeprazole for patient. 90 day supply sent

## 2016-04-09 ENCOUNTER — Encounter: Payer: Self-pay | Admitting: Gastroenterology

## 2016-05-09 ENCOUNTER — Other Ambulatory Visit (INDEPENDENT_AMBULATORY_CARE_PROVIDER_SITE_OTHER): Payer: BLUE CROSS/BLUE SHIELD

## 2016-05-09 ENCOUNTER — Ambulatory Visit (INDEPENDENT_AMBULATORY_CARE_PROVIDER_SITE_OTHER)
Admission: RE | Admit: 2016-05-09 | Discharge: 2016-05-09 | Disposition: A | Discharge status: Home / Self Care | Payer: BLUE CROSS/BLUE SHIELD | Source: Ambulatory Visit | Attending: Internal Medicine | Admitting: Internal Medicine

## 2016-05-09 ENCOUNTER — Encounter: Payer: Self-pay | Admitting: Internal Medicine

## 2016-05-09 ENCOUNTER — Ambulatory Visit: Payer: BLUE CROSS/BLUE SHIELD | Admitting: Internal Medicine

## 2016-05-09 ENCOUNTER — Ambulatory Visit (INDEPENDENT_AMBULATORY_CARE_PROVIDER_SITE_OTHER): Payer: BLUE CROSS/BLUE SHIELD | Admitting: Internal Medicine

## 2016-05-09 VITALS — BP 118/72 | HR 97 | Temp 97.9°F | Resp 16 | Ht 69.0 in | Wt 218.5 lb

## 2016-05-09 DIAGNOSIS — F329 Major depressive disorder, single episode, unspecified: Secondary | ICD-10-CM

## 2016-05-09 DIAGNOSIS — M545 Low back pain, unspecified: Secondary | ICD-10-CM | POA: Insufficient documentation

## 2016-05-09 DIAGNOSIS — F32A Depression, unspecified: Secondary | ICD-10-CM

## 2016-05-09 DIAGNOSIS — K2100 Gastro-esophageal reflux disease with esophagitis, without bleeding: Secondary | ICD-10-CM

## 2016-05-09 DIAGNOSIS — R946 Abnormal results of thyroid function studies: Secondary | ICD-10-CM | POA: Diagnosis not present

## 2016-05-09 DIAGNOSIS — R7989 Other specified abnormal findings of blood chemistry: Secondary | ICD-10-CM

## 2016-05-09 DIAGNOSIS — F450 Somatization disorder: Secondary | ICD-10-CM

## 2016-05-09 DIAGNOSIS — Z23 Encounter for immunization: Secondary | ICD-10-CM | POA: Diagnosis not present

## 2016-05-09 LAB — CBC WITH DIFFERENTIAL/PLATELET
Basophils Absolute: 0 10*3/uL (ref 0.0–0.1)
Basophils Relative: 0.2 % (ref 0.0–3.0)
EOS PCT: 0.5 % (ref 0.0–5.0)
Eosinophils Absolute: 0 10*3/uL (ref 0.0–0.7)
HEMATOCRIT: 37.2 % (ref 36.0–46.0)
Hemoglobin: 12.8 g/dL (ref 12.0–15.0)
LYMPHS ABS: 1.7 10*3/uL (ref 0.7–4.0)
LYMPHS PCT: 23.5 % (ref 12.0–46.0)
MCHC: 34.3 g/dL (ref 30.0–36.0)
MCV: 86.2 fl (ref 78.0–100.0)
MONOS PCT: 9 % (ref 3.0–12.0)
Monocytes Absolute: 0.7 10*3/uL (ref 0.1–1.0)
NEUTROS PCT: 66.8 % (ref 43.0–77.0)
Neutro Abs: 4.9 10*3/uL (ref 1.4–7.7)
Platelets: 274 10*3/uL (ref 150.0–400.0)
RBC: 4.32 Mil/uL (ref 3.87–5.11)
RDW: 13.4 % (ref 11.5–15.5)
WBC: 7.4 10*3/uL (ref 4.0–10.5)

## 2016-05-09 MED ORDER — DULOXETINE HCL 30 MG PO CPEP: 30.0000 mg | ORAL_CAPSULE | Freq: Every day | ORAL | 1 refills | Status: DC

## 2016-05-09 MED ORDER — DEXLANSOPRAZOLE 60 MG PO CPDR: 60.0000 mg | DELAYED_RELEASE_CAPSULE | Freq: Every day | ORAL | 11 refills | Status: DC

## 2016-05-09 NOTE — Progress Notes (Signed)
Pre visit review using our clinic review tool, if applicable. No additional management support is needed unless otherwise documented below in the visit note. 

## 2016-05-09 NOTE — Patient Instructions (Signed)

## 2016-05-10 ENCOUNTER — Encounter: Payer: Self-pay | Admitting: Internal Medicine

## 2016-05-10 LAB — THYROID PANEL WITH TSH
Free Thyroxine Index: 2.3 (ref 1.4–3.8)
T3 Uptake: 32 % (ref 22–35)
T4, Total: 7.2 ug/dL (ref 4.5–12.0)
TSH: 1.35 m[IU]/L

## 2016-05-11 NOTE — Progress Notes (Signed)
Subjective:  Patient ID: Maria Pennington, female    DOB: May 03, 1991  Age: 25 y.o. MRN: [id number]  CC: Gastroesophageal Reflux and Depression   HPI Maria Pennington presents for follow-up. She complains of ongoing symptoms of heartburn and belching. She recently saw GI and her upper endoscopy was revealing only for acid reflux disease. She has tried omeprazole for symptom relief but says it does not help very much.  She also complains of a 6 month history of low back pain. The back pain was improving until a month ago when she sustained a fall and now she has intermittent pain in her lower back that she describes as an achy sensation that does not radiate into her legs or feet. She denies paresthesias in her lower extremities. She is currently taking a combination of tramadol, Flexeril, and NSAIDs for symptom relief.  She also wants to try different antidepressant. She has been on Prozac recently but says it's causing sexual side effects and isn't helping much. She complains of crying spells, anhedonia, feelings of worthlessness/helplessness/hopelessness, weight gain, and wants to isolate.  Outpatient Medications Prior to Visit  Medication Sig Dispense Refill  . clonazePAM (KLONOPIN) 0.5 MG tablet Reported on 04/03/2016  0  . Diclofenac Sodium (PENNSAID) 2 % SOLN Place onto the skin.    Marland Kitchen levocetirizine (XYZAL) 5 MG tablet Take 1 tablet (5 mg total) by mouth every evening. 90 tablet 3  . Levonorgestrel (SKYLA) 13.5 MG IUD 1 Act by Intrauterine route once. 1 Intra Uterine Device 0  . topiramate (TOPAMAX) 100 MG tablet Take 100 mg by mouth 2 (two) times daily.    Marland Kitchen omeprazole (PRILOSEC) 40 MG capsule Take 1 capsule (40 mg total) by mouth daily. 90 capsule 3  . FLUoxetine (PROZAC) 10 MG capsule Take 10 mg by mouth every morning.  0   No facility-administered medications prior to visit.     ROS Review of Systems  Constitutional: Positive for fatigue and unexpected weight change. Negative for  activity change, appetite change and diaphoresis.  HENT: Negative.  Negative for trouble swallowing and voice change.   Eyes: Negative.   Respiratory: Negative.  Negative for cough and shortness of breath.   Cardiovascular: Negative.  Negative for chest pain, palpitations and leg swelling.  Gastrointestinal: Negative.  Negative for abdominal pain, constipation, diarrhea, nausea and vomiting.  Endocrine: Negative.   Genitourinary: Negative.   Musculoskeletal: Negative.  Negative for back pain, myalgias and neck pain.  Skin: Negative.  Negative for color change and rash.  Neurological: Negative.  Negative for dizziness.  Hematological: Negative.  Negative for adenopathy. Does not bruise/bleed easily.  Psychiatric/Behavioral: Positive for dysphoric mood. Negative for behavioral problems, decreased concentration, hallucinations, self-injury, sleep disturbance and suicidal ideas. The patient is nervous/anxious. The patient is not hyperactive.     Objective:  BP 118/72 (BP Location: Right Arm, Patient Position: Sitting, Cuff Size: Large)   Pulse 97   Temp 97.9 F (36.6 C) (Oral)   Ht 5\' 9"  (1.753 m)   Wt 218 lb 8 oz (99.1 kg)   SpO2 98%   BMI 32.27 kg/m   BP Readings from Last 3 Encounters:  05/09/16 118/72  04/03/16 116/74  04/02/16 116/68    Wt Readings from Last 3 Encounters:  05/09/16 218 lb 8 oz (99.1 kg)  04/03/16 209 lb (94.8 kg)  04/02/16 209 lb 12.8 oz (95.2 kg)    Physical Exam  Constitutional: She is oriented to person, place, and time. No distress.  HENT:  Mouth/Throat: Oropharynx is clear and moist. No oropharyngeal exudate.  Eyes: Conjunctivae are normal. Right eye exhibits no discharge. Left eye exhibits no discharge. No scleral icterus.  Neck: Normal range of motion. Neck supple. No JVD present. No tracheal deviation present. No thyromegaly present.  Cardiovascular: Normal rate, regular rhythm, normal heart sounds and intact distal pulses.  Exam reveals no gallop  and no friction rub.   No murmur heard. Pulmonary/Chest: Effort normal and breath sounds normal. No stridor. No respiratory distress. She has no wheezes. She has no rales. She exhibits no tenderness.  Abdominal: Soft. Bowel sounds are normal. She exhibits no distension and no mass. There is no tenderness. There is no rebound and no guarding.  Musculoskeletal: Normal range of motion. She exhibits no edema, tenderness or deformity.  Lymphadenopathy:    She has no cervical adenopathy.  Neurological: She is oriented to person, place, and time.  Skin: Skin is warm and dry. No rash noted. She is not diaphoretic. No erythema. No pallor.  Psychiatric: Her behavior is normal. Judgment normal. Her mood appears not anxious. Her affect is not angry. Her speech is not rapid and/or pressured, not delayed and not tangential. She is not withdrawn. Cognition and memory are normal. She exhibits a depressed mood. She expresses no homicidal and no suicidal ideation. She expresses no suicidal plans and no homicidal plans.  She is intermittently tearful She is attentive.  Vitals reviewed.   Lab Results  Component Value Date   WBC 7.4 05/09/2016   HGB 12.8 05/09/2016   HCT 37.2 05/09/2016   PLT 274.0 05/09/2016   GLUCOSE 83 12/27/2015   CHOL 132 10/10/2015   TRIG 61 10/10/2015   HDL 49 10/10/2015   LDLCALC 71 10/10/2015   ALT 11 12/27/2015   AST 14 12/27/2015   NA 136 12/27/2015   K 3.5 12/27/2015   CL 108 12/27/2015   CREATININE 0.74 12/27/2015   BUN 15 12/27/2015   CO2 20 12/27/2015   TSH 1.35 05/09/2016   INR 1.2 (H) 12/27/2015    Dg Lumbar Spine Complete  Result Date: 05/09/2016 CLINICAL DATA:  Low back pain, recent fall EXAM: LUMBAR SPINE - COMPLETE 4+ VIEW COMPARISON:  05/25/2013 FINDINGS: Five views of the lumbar spine submitted. No acute fracture or subluxation. Alignment, disc spaces and vertebral body heights are preserved. IMPRESSION: Negative. Electronically Signed   By: Natasha MeadLiviu  Pop M.D.    On: 05/09/2016 15:46    Assessment & Plan:   Maria Pennington was seen today for gastroesophageal reflux and depression.  Diagnoses and all orders for this visit:  Need for prophylactic vaccination and inoculation against influenza -     Flu Vaccine QUAD 36+ mos IM  Gastroesophageal reflux disease with esophagitis- will try a stronger PPI to see if she can get better symptom relief. -     dexlansoprazole (DEXILANT) 60 MG capsule; Take 1 capsule (60 mg total) by mouth daily. -     CBC with Differential/Platelet; Future  Low TSH level- clinically she is euthyroid and her TFTs are all normal now, will continue to observe since her TPO was previously positive. -     Thyroid Panel With TSH; Future  Depression with somatization- will start Cymbalta at 30 mg a day, asked her to stay in touch with me over the next few months her that we could increase the dose to 60 her possibly 90 or 120 as indicated. -     DULoxetine (CYMBALTA) 30 MG capsule; Take 1 capsule (30  mg total) by mouth daily.  Low back pain without sciatica, unspecified back pain laterality- her plain films and exam are normal, she has no alarm features, will continue the current regimen for symptom relief and if she does not get symptom relief soon then I will consider having an MRI done of her lumbar spine to screen for disc herniation -     DG Lumbar Spine Complete; Future   I have discontinued Ms. Brunty's FLUoxetine, omeprazole, and ranitidine. I am also having her start on DULoxetine and dexlansoprazole. Additionally, I am having her maintain her clonazePAM, topiramate, Diclofenac Sodium, Levonorgestrel, and levocetirizine.  Meds ordered this encounter  Medications  . DISCONTD: ranitidine (ZANTAC) 75 MG tablet    Sig: Take 75 mg by mouth 2 (two) times daily.  . DULoxetine (CYMBALTA) 30 MG capsule    Sig: Take 1 capsule (30 mg total) by mouth daily.    Dispense:  90 capsule    Refill:  1  . dexlansoprazole (DEXILANT) 60 MG  capsule    Sig: Take 1 capsule (60 mg total) by mouth daily.    Dispense:  30 capsule    Refill:  11     Follow-up: Return in about 6 weeks (around 06/20/2016).  Sanda Linger, MD

## 2016-05-23 ENCOUNTER — Encounter: Payer: Self-pay | Admitting: Internal Medicine

## 2016-05-23 ENCOUNTER — Ambulatory Visit (INDEPENDENT_AMBULATORY_CARE_PROVIDER_SITE_OTHER): Payer: BLUE CROSS/BLUE SHIELD | Admitting: Internal Medicine

## 2016-05-23 VITALS — BP 130/88 | HR 108 | Temp 98.1°F | Resp 16 | Ht 69.0 in | Wt 218.5 lb

## 2016-05-23 DIAGNOSIS — J020 Streptococcal pharyngitis: Secondary | ICD-10-CM | POA: Diagnosis not present

## 2016-05-23 MED ORDER — AMOXICILLIN 875 MG PO TABS: 875.0000 mg | ORAL_TABLET | Freq: Two times a day (BID) | ORAL | 0 refills | Status: AC

## 2016-05-23 NOTE — Patient Instructions (Signed)

## 2016-05-23 NOTE — Progress Notes (Signed)
Pre visit review using our clinic review tool, if applicable. No additional management support is needed unless otherwise documented below in the visit note. 

## 2016-05-23 NOTE — Progress Notes (Signed)
Subjective:  Patient ID: Maria Pennington, female    DOB: 06/21/1991  Age: 25 y.o. MRN: [id number]  CC: Sore Throat   HPI Maria RidingGabriella Pennington presents for a 3 day history of sore throat, cervical lymphadenopathy, and fever to 100.5. She denies cough, chills, rash, night sweats, or abdominal pain.  Outpatient Medications Prior to Visit  Medication Sig Dispense Refill  . clonazePAM (KLONOPIN) 0.5 MG tablet Reported on 04/03/2016  0  . dexlansoprazole (DEXILANT) 60 MG capsule Take 1 capsule (60 mg total) by mouth daily. 30 capsule 11  . Diclofenac Sodium (PENNSAID) 2 % SOLN Place onto the skin.    . DULoxetine (CYMBALTA) 30 MG capsule Take 1 capsule (30 mg total) by mouth daily. 90 capsule 1  . levocetirizine (XYZAL) 5 MG tablet Take 1 tablet (5 mg total) by mouth every evening. 90 tablet 3  . Levonorgestrel (SKYLA) 13.5 MG IUD 1 Act by Intrauterine route once. 1 Intra Uterine Device 0  . topiramate (TOPAMAX) 100 MG tablet Take 100 mg by mouth 2 (two) times daily.     No facility-administered medications prior to visit.     ROS Review of Systems  Constitutional: Positive for fever. Negative for appetite change, chills, diaphoresis and fatigue.  HENT: Positive for sore throat. Negative for sinus pressure, trouble swallowing and voice change.   Eyes: Negative.   Respiratory: Negative.  Negative for cough, shortness of breath, wheezing and stridor.   Cardiovascular: Negative.  Negative for chest pain, palpitations and leg swelling.  Gastrointestinal: Negative.  Negative for abdominal pain, constipation, diarrhea, nausea and vomiting.  Endocrine: Negative.   Genitourinary: Negative.  Negative for difficulty urinating.  Musculoskeletal: Negative.  Negative for arthralgias, back pain, myalgias and neck pain.  Skin: Negative.  Negative for color change, pallor and rash.  Allergic/Immunologic: Negative.   Neurological: Negative.   Hematological: Negative.  Negative for adenopathy.    Psychiatric/Behavioral: Negative.     Objective:  BP 130/88 (BP Location: Left Arm, Patient Position: Sitting, Cuff Size: Normal)   Pulse (!) 108   Temp 98.1 F (36.7 C) (Oral)   Resp 16   Ht 5\' 9"  (1.753 m)   Wt 218 lb 8 oz (99.1 kg)   SpO2 98%   BMI 32.27 kg/m   BP Readings from Last 3 Encounters:  05/23/16 130/88  05/09/16 118/72  04/03/16 116/74    Wt Readings from Last 3 Encounters:  05/23/16 218 lb 8 oz (99.1 kg)  05/09/16 218 lb 8 oz (99.1 kg)  04/03/16 209 lb (94.8 kg)    Physical Exam  Constitutional: She is oriented to person, place, and time.  Non-toxic appearance. She does not have a sickly appearance. She does not appear ill. No distress.  HENT:  Mouth/Throat: Mucous membranes are normal. Mucous membranes are not pale, not dry and not cyanotic. No oral lesions. No trismus in the jaw. No uvula swelling. Posterior oropharyngeal erythema present. No oropharyngeal exudate, posterior oropharyngeal edema or tonsillar abscesses.  Eyes: Conjunctivae are normal. Right eye exhibits no discharge. Left eye exhibits no discharge. No scleral icterus.  Neck: Normal range of motion. Neck supple. No JVD present. No tracheal tenderness present. No tracheal deviation present. No thyroid mass and no thyromegaly present.  Cardiovascular: Normal rate, regular rhythm, normal heart sounds and intact distal pulses.  Exam reveals no gallop and no friction rub.   No murmur heard. Pulmonary/Chest: Effort normal and breath sounds normal. No stridor. No respiratory distress. She has no wheezes. She has no  rales. She exhibits no tenderness.  Abdominal: Soft. Bowel sounds are normal. She exhibits no distension and no mass. There is no tenderness. There is no rebound and no guarding.  Musculoskeletal: Normal range of motion. She exhibits no edema or tenderness.  Lymphadenopathy:       Head (right side): No submental, no submandibular, no tonsillar, no preauricular, no posterior auricular and no  occipital adenopathy present.       Head (left side): No submental, no submandibular, no tonsillar, no preauricular, no posterior auricular and no occipital adenopathy present.    She has cervical adenopathy.       Right cervical: Superficial cervical adenopathy present. No deep cervical and no posterior cervical adenopathy present.      Left cervical: Superficial cervical adenopathy present. No deep cervical and no posterior cervical adenopathy present.    She has no axillary adenopathy.       Right: No supraclavicular adenopathy present.       Left: No supraclavicular adenopathy present.  Neurological: She is oriented to person, place, and time.  Skin: Skin is warm and dry. No rash noted. She is not diaphoretic. No erythema. No pallor.  Psychiatric: She has a normal mood and affect. Her behavior is normal. Judgment and thought content normal.  Vitals reviewed.   Lab Results  Component Value Date   WBC 7.4 05/09/2016   HGB 12.8 05/09/2016   HCT 37.2 05/09/2016   PLT 274.0 05/09/2016   GLUCOSE 83 12/27/2015   CHOL 132 10/10/2015   TRIG 61 10/10/2015   HDL 49 10/10/2015   LDLCALC 71 10/10/2015   ALT 11 12/27/2015   AST 14 12/27/2015   NA 136 12/27/2015   K 3.5 12/27/2015   CL 108 12/27/2015   CREATININE 0.74 12/27/2015   BUN 15 12/27/2015   CO2 20 12/27/2015   TSH 1.35 05/09/2016   INR 1.2 (H) 12/27/2015    Dg Lumbar Spine Complete  Result Date: 05/09/2016 CLINICAL DATA:  Low back pain, recent fall EXAM: LUMBAR SPINE - COMPLETE 4+ VIEW COMPARISON:  05/25/2013 FINDINGS: Five views of the lumbar spine submitted. No acute fracture or subluxation. Alignment, disc spaces and vertebral body heights are preserved. IMPRESSION: Negative. Electronically Signed   By: Natasha Mead M.D.   On: 05/09/2016 15:46    Assessment & Plan:   Maria Pennington was seen today for sore throat.  Diagnoses and all orders for this visit:  Streptococcal sore throat- her Centor score is 3 so I will treat  empirically for strep throat with amoxicillin -     amoxicillin (AMOXIL) 875 MG tablet; Take 1 tablet (875 mg total) by mouth 2 (two) times daily.   I am having Maria Pennington start on amoxicillin. I am also having her maintain her clonazePAM, topiramate, Diclofenac Sodium, Levonorgestrel, levocetirizine, DULoxetine, and dexlansoprazole.  Meds ordered this encounter  Medications  . amoxicillin (AMOXIL) 875 MG tablet    Sig: Take 1 tablet (875 mg total) by mouth 2 (two) times daily.    Dispense:  20 tablet    Refill:  0     Follow-up: Return if symptoms worsen or fail to improve.  Sanda Linger, MD

## 2016-06-04 ENCOUNTER — Ambulatory Visit: Payer: BLUE CROSS/BLUE SHIELD | Admitting: Gastroenterology

## 2016-06-19 ENCOUNTER — Other Ambulatory Visit: Payer: Self-pay | Admitting: Internal Medicine

## 2016-06-19 ENCOUNTER — Telehealth: Payer: Self-pay

## 2016-06-19 DIAGNOSIS — Z6833 Body mass index (BMI) 33.0-33.9, adult: Principal | ICD-10-CM

## 2016-06-19 DIAGNOSIS — E6609 Other obesity due to excess calories: Secondary | ICD-10-CM

## 2016-06-19 MED ORDER — PHENTERMINE HCL 37.5 MG PO CAPS: 37.5000 mg | ORAL_CAPSULE | ORAL | 2 refills | Status: DC

## 2016-06-19 NOTE — Telephone Encounter (Signed)
Pt called and rq rx for phentermine. Please advise.

## 2016-06-19 NOTE — Telephone Encounter (Signed)
Faxed to [facility] 

## 2016-06-19 NOTE — Telephone Encounter (Signed)
RX written 

## 2016-06-20 NOTE — Telephone Encounter (Signed)
refaxed to [facility]

## 2016-06-22 ENCOUNTER — Encounter: Payer: Self-pay | Admitting: Internal Medicine

## 2016-06-24 ENCOUNTER — Telehealth: Payer: Self-pay | Admitting: Internal Medicine

## 2016-06-24 ENCOUNTER — Ambulatory Visit: Payer: Self-pay | Admitting: Allergy and Immunology

## 2016-06-24 NOTE — Telephone Encounter (Signed)
Invoice number on labs that are being billed is [id number]

## 2016-06-26 NOTE — Telephone Encounter (Signed)
Called [facility] about patient bill. Pt owes $62.44, mostly from a T3 test that [employer] deemed "experimental". I have contacted coding to confirm this was coded correctly.

## 2016-06-26 NOTE — Telephone Encounter (Signed)
[facility] $[facility], $46.50 is coming from the T3 update test. I have confirmed with coding that this is coded correctly, but [employer] responded that it was experimental. The other $15.94 was considered patient coinsurance per [employer]. Could you ask the patient to try to get more info from her insurance company regarding the unpaid test? We are unsure how to help at this point.

## 2016-06-27 NOTE — Telephone Encounter (Signed)
Left message for patient with response.

## 2016-07-24 ENCOUNTER — Ambulatory Visit (INDEPENDENT_AMBULATORY_CARE_PROVIDER_SITE_OTHER): Payer: BLUE CROSS/BLUE SHIELD | Admitting: Internal Medicine

## 2016-07-24 ENCOUNTER — Encounter: Payer: Self-pay | Admitting: Internal Medicine

## 2016-07-24 DIAGNOSIS — J069 Acute upper respiratory infection, unspecified: Secondary | ICD-10-CM | POA: Insufficient documentation

## 2016-07-24 DIAGNOSIS — B9789 Other viral agents as the cause of diseases classified elsewhere: Secondary | ICD-10-CM

## 2016-07-24 MED ORDER — BENZONATATE 100 MG PO CAPS: 100.0000 mg | ORAL_CAPSULE | Freq: Two times a day (BID) | ORAL | 0 refills | Status: DC | PRN

## 2016-07-24 NOTE — Assessment & Plan Note (Signed)
She will continue xyzal and can use affrin as needed. Rx for tessalon perles for cough. No indication for antibiotics today or steroids.

## 2016-07-24 NOTE — Progress Notes (Signed)
   Subjective:    Patient ID: Maria Pennington, female    DOB: 09/14/1991, 25 y.o.   MRN: [id number]  HPI The patient is a 25 YO female coming in for cough. No voice and post nasal drip for about 1 week. No fevers or chills. She is coming in due to employer made her come. She is taking xyzal, she has been around sick contacts at work. Denies ear pain or drainage. Is starting to feel better today. Started Thursday and Mon/Tues were the worst for her symptoms.   Review of Systems  Constitutional: Negative for activity change, appetite change, fatigue, fever and unexpected weight change.  HENT: Positive for congestion and postnasal drip. Negative for nosebleeds, rhinorrhea, sinus pain, sore throat and trouble swallowing.   Eyes: Negative.   Respiratory: Positive for cough. Negative for chest tightness, shortness of breath and wheezing.   Cardiovascular: Negative.   Gastrointestinal: Negative.       Objective:   Physical Exam  Constitutional: She is oriented to person, place, and time. She appears well-developed and well-nourished.  HENT:  Head: Normocephalic and atraumatic.  Right Ear: External ear normal.  Left Ear: External ear normal.  Oropharynx with redness and clear drainage.   Eyes: EOM are normal.  Neck: Normal range of motion.  Cardiovascular: Normal rate and regular rhythm.   Pulmonary/Chest: Effort normal and breath sounds normal. No respiratory distress. She has no wheezes. She has no rales.  Abdominal: Soft.  Lymphadenopathy:    She has no cervical adenopathy.  Neurological: She is alert and oriented to person, place, and time.  Skin: Skin is warm and dry.   Vitals:   07/24/16 1000  BP: 98/78  Pulse: (!) 116  Resp: 16  Temp: 98.4 F (36.9 C)  TempSrc: Oral  SpO2: 99%  Weight: 225 lb (102.1 kg)  Height: 5\' 7"  (1.702 m)      Assessment & Plan:

## 2016-07-24 NOTE — Progress Notes (Signed)
Pre visit review using our clinic review tool, if applicable. No additional management support is needed unless otherwise documented below in the visit note. 

## 2016-07-24 NOTE — Patient Instructions (Signed)
We have sent in the tessalon perles for the cough. You can take 1 pill up to 3 times a day.   You can also try affrin over the counter to use for the drainage. Do not take it more than about 5-7 days or you can get rebound if you stop. This can block up your nose a lot when you stop (but it will pass in 1-2 days).    Upper Respiratory Infection, Adult Most upper respiratory infections (URIs) are a viral infection of the air passages leading to the lungs. A URI affects the nose, throat, and upper air passages. The most common type of URI is nasopharyngitis and is typically referred to as "the common cold." URIs run their course and usually go away on their own. Most of the time, a URI does not require medical attention, but sometimes a bacterial infection in the upper airways can follow a viral infection. This is called a secondary infection. Sinus and middle ear infections are common types of secondary upper respiratory infections. Bacterial pneumonia can also complicate a URI. A URI can worsen asthma and chronic obstructive pulmonary disease (COPD). Sometimes, these complications can require emergency medical care and may be life threatening.  CAUSES Almost all URIs are caused by viruses. A virus is a type of germ and can spread from one person to another.  RISKS FACTORS You may be at risk for a URI if:   You smoke.   You have chronic heart or lung disease.  You have a weakened defense (immune) system.   You are very young or very old.   You have nasal allergies or asthma.  You work in crowded or poorly ventilated areas.  You work in health care facilities or schools. SIGNS AND SYMPTOMS  Symptoms typically develop 2-3 days after you come in contact with a cold virus. Most viral URIs last 7-10 days. However, viral URIs from the influenza virus (flu virus) can last 14-18 days and are typically more severe. Symptoms may include:   Runny or stuffy (congested) nose.   Sneezing.    Cough.   Sore throat.   Headache.   Fatigue.   Fever.   Loss of appetite.   Pain in your forehead, behind your eyes, and over your cheekbones (sinus pain).  Muscle aches.  DIAGNOSIS  Your health care provider may diagnose a URI by:  Physical exam.  Tests to check that your symptoms are not due to another condition such as:  Strep throat.  Sinusitis.  Pneumonia.  Asthma. TREATMENT  A URI goes away on its own with time. It cannot be cured with medicines, but medicines may be prescribed or recommended to relieve symptoms. Medicines may help:  Reduce your fever.  Reduce your cough.  Relieve nasal congestion. HOME CARE INSTRUCTIONS   Take medicines only as directed by your health care provider.   Gargle warm saltwater or take cough drops to comfort your throat as directed by your health care provider.  Use a warm mist humidifier or inhale steam from a shower to increase air moisture. This may make it easier to breathe.  Drink enough fluid to keep your urine clear or pale yellow.   Eat soups and other clear broths and maintain good nutrition.   Rest as needed.   Return to work when your temperature has returned to normal or as your health care provider advises. You may need to stay home longer to avoid infecting others. You can also use a face mask  and careful hand washing to prevent spread of the virus.  Increase the usage of your inhaler if you have asthma.   Do not use any tobacco products, including cigarettes, chewing tobacco, or electronic cigarettes. If you need help quitting, ask your health care provider. PREVENTION  The best way to protect yourself from getting a cold is to practice good hygiene.   Avoid oral or hand contact with people with cold symptoms.   Wash your hands often if contact occurs.  There is no clear evidence that vitamin C, vitamin E, echinacea, or exercise reduces the chance of developing a cold. However, it is  always recommended to get plenty of rest, exercise, and practice good nutrition.  SEEK MEDICAL CARE IF:   You are getting worse rather than better.   Your symptoms are not controlled by medicine.   You have chills.  You have worsening shortness of breath.  You have brown or red mucus.  You have yellow or brown nasal discharge.  You have pain in your face, especially when you bend forward.  You have a fever.  You have swollen neck glands.  You have pain while swallowing.  You have white areas in the back of your throat. SEEK IMMEDIATE MEDICAL CARE IF:   You have severe or persistent:  Headache.  Ear pain.  Sinus pain.  Chest pain.  You have chronic lung disease and any of the following:  Wheezing.  Prolonged cough.  Coughing up blood.  A change in your usual mucus.  You have a stiff neck.  You have changes in your:  Vision.  Hearing.  Thinking.  Mood. MAKE SURE YOU:   Understand these instructions.  Will watch your condition.  Will get help right away if you are not doing well or get worse.   This information is not intended to replace advice given to you by your health care provider. Make sure you discuss any questions you have with your health care provider.   Document Released: 02/26/2001 Document Revised: 01/17/2015 Document Reviewed: 12/08/2013 Elsevier Interactive Patient Education [employer]2016 Elsevier Inc.

## 2016-08-16 ENCOUNTER — Other Ambulatory Visit: Payer: Self-pay | Admitting: Internal Medicine

## 2016-08-16 DIAGNOSIS — R233 Spontaneous ecchymoses: Secondary | ICD-10-CM

## 2016-08-16 DIAGNOSIS — R238 Other skin changes: Secondary | ICD-10-CM

## 2016-10-01 ENCOUNTER — Telehealth: Payer: Self-pay | Admitting: Internal Medicine

## 2016-10-07 ENCOUNTER — Other Ambulatory Visit (INDEPENDENT_AMBULATORY_CARE_PROVIDER_SITE_OTHER): Payer: BLUE CROSS/BLUE SHIELD

## 2016-10-07 ENCOUNTER — Encounter: Payer: Self-pay | Admitting: Internal Medicine

## 2016-10-07 ENCOUNTER — Ambulatory Visit (INDEPENDENT_AMBULATORY_CARE_PROVIDER_SITE_OTHER): Payer: BLUE CROSS/BLUE SHIELD | Admitting: Internal Medicine

## 2016-10-07 VITALS — BP 110/80 | HR 95 | Temp 98.0°F | Resp 16 | Ht 67.0 in | Wt 228.0 lb

## 2016-10-07 DIAGNOSIS — R233 Spontaneous ecchymoses: Secondary | ICD-10-CM

## 2016-10-07 DIAGNOSIS — F329 Major depressive disorder, single episode, unspecified: Secondary | ICD-10-CM

## 2016-10-07 DIAGNOSIS — N3000 Acute cystitis without hematuria: Secondary | ICD-10-CM | POA: Diagnosis not present

## 2016-10-07 DIAGNOSIS — F450 Somatization disorder: Secondary | ICD-10-CM

## 2016-10-07 DIAGNOSIS — E538 Deficiency of other specified B group vitamins: Secondary | ICD-10-CM

## 2016-10-07 DIAGNOSIS — F32A Depression, unspecified: Secondary | ICD-10-CM

## 2016-10-07 DIAGNOSIS — R238 Other skin changes: Secondary | ICD-10-CM

## 2016-10-07 LAB — CBC WITH DIFFERENTIAL/PLATELET
BASOS PCT: 0.1 % (ref 0.0–3.0)
Basophils Absolute: 0 10*3/uL (ref 0.0–0.1)
EOS ABS: 0 10*3/uL (ref 0.0–0.7)
Eosinophils Relative: 0.5 % (ref 0.0–5.0)
HCT: 39.3 % (ref 36.0–46.0)
Hemoglobin: 13.4 g/dL (ref 12.0–15.0)
Lymphocytes Relative: 25.8 % (ref 12.0–46.0)
Lymphs Abs: 2 10*3/uL (ref 0.7–4.0)
MCHC: 34.2 g/dL (ref 30.0–36.0)
MCV: 85.7 fl (ref 78.0–100.0)
MONO ABS: 0.5 10*3/uL (ref 0.1–1.0)
Monocytes Relative: 6.7 % (ref 3.0–12.0)
NEUTROS ABS: 5.2 10*3/uL (ref 1.4–7.7)
Neutrophils Relative %: 66.9 % (ref 43.0–77.0)
PLATELETS: 298 10*3/uL (ref 150.0–400.0)
RBC: 4.59 Mil/uL (ref 3.87–5.11)
RDW: 13.2 % (ref 11.5–15.5)
WBC: 7.8 10*3/uL (ref 4.0–10.5)

## 2016-10-07 LAB — IBC PANEL
IRON: 141 ug/dL (ref 42–145)
Saturation Ratios: 46.2 % (ref 20.0–50.0)
Transferrin: 218 mg/dL (ref 212.0–360.0)

## 2016-10-07 LAB — APTT: aPTT: 29.9 s (ref 23.4–32.7)

## 2016-10-07 LAB — COMPREHENSIVE METABOLIC PANEL
ALBUMIN: 4.1 g/dL (ref 3.5–5.2)
ALT: 11 U/L (ref 0–35)
AST: 12 U/L (ref 0–37)
Alkaline Phosphatase: 35 U/L — ABNORMAL LOW (ref 39–117)
BUN: 11 mg/dL (ref 6–23)
CHLORIDE: 109 meq/L (ref 96–112)
CO2: 21 mEq/L (ref 19–32)
Calcium: 9 mg/dL (ref 8.4–10.5)
Creatinine, Ser: 0.64 mg/dL (ref 0.40–1.20)
GFR: 120.61 mL/min (ref >60.00)
GLUCOSE: 90 mg/dL (ref 70–99)
POTASSIUM: 3.5 meq/L (ref 3.5–5.1)
SODIUM: 138 meq/L (ref 135–145)
Total Bilirubin: 0.6 mg/dL (ref 0.2–1.2)
Total Protein: 7.2 g/dL (ref 6.0–8.3)

## 2016-10-07 LAB — URINALYSIS, ROUTINE W REFLEX MICROSCOPIC
Bilirubin Urine: NEGATIVE
HGB URINE DIPSTICK: NEGATIVE
Ketones, ur: NEGATIVE
Leukocytes, UA: NEGATIVE
NITRITE: NEGATIVE
PH: 6.5 (ref 5.0–8.0)
RBC / HPF: NONE SEEN (ref 0–?)
Specific Gravity, Urine: 1.02 (ref 1.000–1.030)
TOTAL PROTEIN, URINE-UPE24: NEGATIVE
URINE GLUCOSE: NEGATIVE
UROBILINOGEN UA: 0.2 (ref 0.0–1.0)

## 2016-10-07 LAB — VITAMIN B12: VITAMIN B 12: 292 pg/mL (ref 211–911)

## 2016-10-07 LAB — HCG, QUANTITATIVE, PREGNANCY: QUANTITATIVE HCG: 0.33 m[IU]/mL

## 2016-10-07 LAB — FOLATE: Folate: 13.1 ng/mL (ref >5.9)

## 2016-10-07 LAB — PROTIME-INR
INR: 1.2 ratio — ABNORMAL HIGH (ref 0.8–1.0)
Prothrombin Time: 12.5 s (ref 9.6–13.1)

## 2016-10-07 LAB — FERRITIN: FERRITIN: 63.8 ng/mL (ref 10.0–291.0)

## 2016-10-07 MED ORDER — VORTIOXETINE HBR 10 MG PO TABS: 10.0000 mg | ORAL_TABLET | Freq: Every day | ORAL | 11 refills | Status: DC

## 2016-10-07 NOTE — Patient Instructions (Signed)
Major Depressive Disorder, Adult Major depressive disorder (MDD) is a mental health condition. It may also be called clinical depression or unipolar depression. MDD usually causes feelings of sadness, hopelessness, or helplessness. MDD can also cause physical symptoms. It can interfere with work, school, relationships, and other everyday activities. MDD may be mild, moderate, or severe. It may occur once (single episode major depressive disorder) or it may occur multiple times (recurrent major depressive disorder). What are the causes? The exact cause of this condition is not known. MDD is most likely caused by a combination of things, which may include:  Genetic factors. These are traits that are passed along from parent to child.  Individual factors. Your personality, your behavior, and the way you handle your thoughts and feelings may contribute to MDD. This includes personality traits and behaviors learned from others.  Physical factors, such as: ? Differences in the part of your brain that controls emotion. This part of your brain may be different than it is in people who do not have MDD. ? Long-term (chronic) medical or psychiatric illnesses.  Social factors. Traumatic experiences or major life changes may play a role in the development of MDD.  What increases the risk? This condition is more likely to develop in women. The following factors may also make you more likely to develop MDD:  A family history of depression.  Troubled family relationships.  Abnormally low levels of certain brain chemicals.  Traumatic events in childhood, especially abuse or the loss of a parent.  Being under a lot of stress, or long-term stress, especially from upsetting life experiences or losses.  A history of: ? Chronic physical illness. ? Other mental health disorders. ? Substance abuse.  Poor living conditions.  Experiencing social exclusion or discrimination on a regular basis.  What are  the signs or symptoms? The main symptoms of MDD typically include:  Constant depressed or irritable mood.  Loss of interest in things and activities.  MDD symptoms may also include:  Sleeping or eating too much or too little.  Unexplained weight change.  Fatigue or low energy.  Feelings of worthlessness or guilt.  Difficulty thinking clearly or making decisions.  Thoughts of suicide or of harming others.  Physical agitation or weakness.  Isolation.  Severe cases of MDD may also occur with other symptoms, such as:  Delusions or hallucinations, in which you imagine things that are not real (psychotic depression).  Low-level depression that lasts at least a year (chronic depression or persistent depressive disorder).  Extreme sadness and hopelessness (melancholic depression).  Trouble speaking and moving (catatonic depression).  How is this diagnosed? This condition may be diagnosed based on:  Your symptoms.  Your medical history, including your mental health history. This may involve tests to evaluate your mental health. You may be asked questions about your lifestyle, including any drug and alcohol use, and how long you have had symptoms of MDD.  A physical exam.  Blood tests to rule out other conditions.  You must have a depressed mood and at least four other MDD symptoms most of the day, nearly every day in the same 2-week timeframe before your health care provider can confirm a diagnosis of MDD. How is this treated? This condition is usually treated by mental health professionals, such as psychologists, psychiatrists, and clinical social workers. You may need more than one type of treatment. Treatment may include:  Psychotherapy. This is also called talk therapy or counseling. Types of psychotherapy include: ? Cognitive behavioral   therapy (CBT). This type of therapy teaches you to recognize unhealthy feelings, thoughts, and behaviors, and replace them with  positive thoughts and actions. ? Interpersonal therapy (IPT). This helps you to improve the way you relate to and communicate with others. ? Family therapy. This treatment includes members of your family.  Medicine to treat anxiety and depression, or to help you control certain emotions and behaviors.  Lifestyle changes, such as: ? Limiting alcohol and drug use. ? Exercising regularly. ? Getting plenty of sleep. ? Making healthy eating choices. ? Spending more time outdoors.  Treatments involving stimulation of the brain can be used in situations with extremely severe symptoms, or when medicine or other therapies do not work over time. These treatments include electroconvulsive therapy, transcranial magnetic stimulation, and vagal nerve stimulation. Follow these instructions at home: Activity  Return to your normal activities as told by your health care provider.  Exercise regularly and spend time outdoors as told by your health care provider. General instructions  Take over-the-counter and prescription medicines only as told by your health care provider.  Do not drink alcohol. If you drink alcohol, limit your alcohol intake to no more than 1 drink a day for nonpregnant women and 2 drinks a day for men. One drink equals 12 oz of beer, 5 oz of wine, or 1 oz of hard liquor. Alcohol can affect any antidepressant medicines you are taking. Talk to your health care provider about your alcohol use.  Eat a healthy diet and get plenty of sleep.  Find activities that you enjoy doing, and make time to do them.  Consider joining a support group. Your health care provider may be able to recommend a support group.  Keep all follow-up visits as told by your health care provider. This is important. Where to find more information: National Alliance on Mental Illness  www.nami.org  [place]. National Institute of Mental Health  www.nimh.nih.gov  National Suicide Prevention  Lifeline  1-800-273-TALK (8255). This is free, 24-hour help.  Contact a health care provider if:  Your symptoms get worse.  You develop new symptoms. Get help right away if:  You self-harm.  You have serious thoughts about hurting yourself or others.  You see, hear, taste, smell, or feel things that are not present (hallucinate). This information is not intended to replace advice given to you by your health care provider. Make sure you discuss any questions you have with your health care provider. Document Released: 12/28/2012 Document Revised: 05/09/2016 Document Reviewed: 03/13/2016 Elsevier Interactive Patient Education  2017 Elsevier Inc.  

## 2016-10-07 NOTE — Progress Notes (Signed)
Pre visit review using our clinic review tool, if applicable. No additional management support is needed unless otherwise documented below in the visit note. 

## 2016-10-07 NOTE — Progress Notes (Signed)
Subjective:  Patient ID: Maria Pennington, female    DOB: November 08, 1990  Age: 26 y.o. MRN: [id number]  CC: Urinary Tract Infection and Depression   HPI Maria Pennington presents for follow-up with concerns about easy bruising. She has a couple of benign, asymptomatic bruises on her upper and lower extremities as well as her left upper chest. She doesn't recall any trauma or injury. She does not experience nosebleeds, bleeding gums, blood in her urine or stool, and she tells me her menstrual cycles are only about 3 days with light bleeding. She thought that this may have been related to Cymbalta therapy so she stopped taking Cymbalta about a month ago but says the bruising has not improved.  She complains of ongoing symptoms of anxiety and depression with crying spells, anhedonia, sleep disturbance, and fatigue and wants to try a different antidepressant.  Outpatient Medications Prior to Visit  Medication Sig Dispense Refill  . dexlansoprazole (DEXILANT) 60 MG capsule Take 1 capsule (60 mg total) by mouth daily. 30 capsule 11  . Diclofenac Sodium (PENNSAID) 2 % SOLN Place onto the skin.    Marland Kitchen levocetirizine (XYZAL) 5 MG tablet Take 1 tablet (5 mg total) by mouth every evening. 90 tablet 3  . Levonorgestrel (SKYLA) 13.5 MG IUD 1 Act by Intrauterine route once. 1 Intra Uterine Device 0  . topiramate (TOPAMAX) 100 MG tablet Take 100 mg by mouth 2 (two) times daily.    . clonazePAM (KLONOPIN) 0.5 MG tablet Reported on 04/03/2016  0  . DULoxetine (CYMBALTA) 30 MG capsule Take 1 capsule (30 mg total) by mouth daily. (Patient not taking: Reported on 10/07/2016) 90 capsule 1   No facility-administered medications prior to visit.     ROS Review of Systems  Constitutional: Negative for chills, diaphoresis, fatigue and unexpected weight change.  HENT: Negative.  Negative for facial swelling, nosebleeds, sinus pressure, sore throat and trouble swallowing.   Eyes: Negative for visual disturbance.    Respiratory: Negative for cough, chest tightness, shortness of breath and wheezing.   Cardiovascular: Negative for chest pain, palpitations and leg swelling.  Gastrointestinal: Negative for abdominal pain, anal bleeding, blood in stool, constipation, diarrhea, nausea and vomiting.  Endocrine: Negative.   Genitourinary: Positive for dysuria. Negative for difficulty urinating, hematuria, menstrual problem, pelvic pain and vaginal bleeding.       She has had a few episodes of dysuria over the last 5 days, she had amoxicillin at home so she started taking it and tells me the symptoms have resolved.  Musculoskeletal: Negative for arthralgias, back pain, joint swelling and myalgias.  Skin: Negative for color change, pallor and rash.  Allergic/Immunologic: Negative.   Neurological: Negative.  Negative for dizziness, weakness, light-headedness and numbness.  Hematological: Negative for adenopathy. Bruises/bleeds easily.  Psychiatric/Behavioral: Positive for dysphoric mood and sleep disturbance. Negative for agitation, behavioral problems, decreased concentration, self-injury and suicidal ideas. The patient is nervous/anxious. The patient is not hyperactive.     Objective:  BP 110/80 (BP Location: Left Arm, Patient Position: Sitting, Cuff Size: Large)   Pulse 95   Temp 98 F (36.7 C) (Oral)   Resp 16   Ht 5\' 7"  (1.702 m)   Wt 228 lb (103.4 kg)   LMP 09/08/2016   SpO2 98%   BMI 35.71 kg/m   BP Readings from Last 3 Encounters:  10/07/16 110/80  07/24/16 98/78  05/23/16 130/88    Wt Readings from Last 3 Encounters:  10/07/16 228 lb (103.4 kg)  07/24/16 225 lb (  102.1 kg)  05/23/16 218 lb 8 oz (99.1 kg)    Physical Exam  Constitutional: She is oriented to person, place, and time. No distress.  HENT:  Mouth/Throat: Oropharynx is clear and moist. No oropharyngeal exudate.  Eyes: Conjunctivae are normal. Right eye exhibits no discharge. Left eye exhibits no discharge. No scleral icterus.   Neck: Normal range of motion. Neck supple. No JVD present. No tracheal deviation present. No thyromegaly present.  Cardiovascular: Normal rate, regular rhythm, normal heart sounds and intact distal pulses.  Exam reveals no gallop and no friction rub.   No murmur heard. Pulmonary/Chest: Effort normal. No stridor. No respiratory distress. She has no wheezes. She has no rales. She exhibits no tenderness.  Abdominal: Soft. Bowel sounds are normal. She exhibits no distension. There is no tenderness. There is no rebound and no guarding.  Musculoskeletal: Normal range of motion. She exhibits no edema, tenderness or deformity.  Lymphadenopathy:    She has no cervical adenopathy.  Neurological: She is oriented to person, place, and time.  Skin: Skin is warm and dry. Bruising noted. No ecchymosis, no lesion, no petechiae and no rash noted. Rash is not pustular and not vesicular. She is not diaphoretic. No erythema. No pallor.  Psychiatric: Her speech is normal. Judgment and thought content normal. Her mood appears anxious. She is not agitated, not slowed and not withdrawn. Cognition and memory are normal. She exhibits a depressed mood. She expresses no homicidal and no suicidal ideation. She expresses no suicidal plans and no homicidal plans.  tearful  Vitals reviewed.   Lab Results  Component Value Date   WBC 7.8 10/07/2016   HGB 13.4 10/07/2016   HCT 39.3 10/07/2016   PLT 298.0 10/07/2016   GLUCOSE 90 10/07/2016   CHOL 132 10/10/2015   TRIG 61 10/10/2015   HDL 49 10/10/2015   LDLCALC 71 10/10/2015   ALT 11 10/07/2016   AST 12 10/07/2016   NA 138 10/07/2016   K 3.5 10/07/2016   CL 109 10/07/2016   CREATININE 0.64 10/07/2016   BUN 11 10/07/2016   CO2 21 10/07/2016   TSH 1.35 05/09/2016   INR 1.2 (H) 10/07/2016    Dg Lumbar Spine Complete  Result Date: 05/09/2016 CLINICAL DATA:  Low back pain, recent fall EXAM: LUMBAR SPINE - COMPLETE 4+ VIEW COMPARISON:  05/25/2013 FINDINGS: Five  views of the lumbar spine submitted. No acute fracture or subluxation. Alignment, disc spaces and vertebral body heights are preserved. IMPRESSION: Negative. Electronically Signed   By: Natasha MeadLiviu  Pop M.D.   On: 05/09/2016 15:46    Assessment & Plan:   Maria CommonGabriella was seen today for urinary tract infection and depression.  Diagnoses and all orders for this visit:  Easy bruising- her platelet count is normal in size and appearance, her coags are normal, she has a moderately low B12 levels so I think this may be the cause for the easy bruising and of asked her to start B12 replacement therapy, the remainder of her labs are within normal limits indicating normal liver and renal function, no evidence of anemia, she is not pregnant,. I will also check a zinc level to see if zinc deficiency is causing her symptoms. -     IBC panel; Future -     CBC with Differential/Platelet; Future -     Vitamin B12; Future -     Comprehensive metabolic panel; Future -     Ferritin; Future -     Folate; Future -  Zinc; Future -     hCG, quantitative, pregnancy; Future -     Protime-INR; Future -     APTT; Future  Depression with somatization- I have asked her to start taking Trintellix to treat this. -     vortioxetine HBr (TRINTELLIX) 10 MG TABS; Take 1 tablet (10 mg total) by mouth daily. -     hCG, quantitative, pregnancy; Future  Acute cystitis without hematuria- I will check her UA and urine culture to see if there is a urinary tract infection that needs to be treated. -     hCG, quantitative, pregnancy; Future -     Urinalysis, Routine w reflex microscopic; Future -     CULTURE, URINE COMPREHENSIVE; Future  B12 deficiency- I've asked her to start B12 replacement therapy.   I have discontinued Ms. Lighty's clonazePAM and DULoxetine. I am also having her start on vortioxetine HBr. Additionally, I am having her maintain her topiramate, Diclofenac Sodium, Levonorgestrel, levocetirizine, and  dexlansoprazole.  Meds ordered this encounter  Medications  . vortioxetine HBr (TRINTELLIX) 10 MG TABS    Sig: Take 1 tablet (10 mg total) by mouth daily.    Dispense:  30 tablet    Refill:  11     Follow-up: Return in about 2 months (around 12/05/2016).  Sanda Linger, MD

## 2016-10-08 ENCOUNTER — Encounter: Payer: Self-pay | Admitting: Internal Medicine

## 2016-10-08 DIAGNOSIS — E538 Deficiency of other specified B group vitamins: Secondary | ICD-10-CM | POA: Insufficient documentation

## 2016-10-09 ENCOUNTER — Other Ambulatory Visit: Payer: Self-pay | Admitting: Internal Medicine

## 2016-10-09 ENCOUNTER — Encounter: Payer: Self-pay | Admitting: Internal Medicine

## 2016-10-09 DIAGNOSIS — N390 Urinary tract infection, site not specified: Secondary | ICD-10-CM

## 2016-10-09 DIAGNOSIS — B9620 Unspecified Escherichia coli [E. coli] as the cause of diseases classified elsewhere: Secondary | ICD-10-CM | POA: Insufficient documentation

## 2016-10-09 LAB — CULTURE, URINE COMPREHENSIVE

## 2016-10-09 LAB — ZINC: Zinc: 78 ug/dL (ref 60–130)

## 2016-10-09 MED ORDER — SULFAMETHOXAZOLE-TRIMETHOPRIM 800-160 MG PO TABS: 1.0000 | ORAL_TABLET | Freq: Two times a day (BID) | ORAL | 0 refills | Status: DC

## 2016-10-09 MED ORDER — NITROFURANTOIN MONOHYD MACRO 100 MG PO CAPS: 100.0000 mg | ORAL_CAPSULE | Freq: Two times a day (BID) | ORAL | 0 refills | Status: AC

## 2016-10-10 ENCOUNTER — Other Ambulatory Visit: Payer: Self-pay | Admitting: Internal Medicine

## 2016-10-10 DIAGNOSIS — E538 Deficiency of other specified B group vitamins: Secondary | ICD-10-CM

## 2016-10-10 MED ORDER — CYANOCOBALAMIN 2000 MCG PO TABS: 2000.0000 ug | ORAL_TABLET | Freq: Every day | ORAL | 3 refills | Status: DC

## 2016-10-10 NOTE — Telephone Encounter (Signed)
Pt rq rx for b12.   To [facility] in [place].

## 2016-10-15 ENCOUNTER — Other Ambulatory Visit: Payer: Self-pay | Admitting: Internal Medicine

## 2016-10-15 DIAGNOSIS — N3000 Acute cystitis without hematuria: Secondary | ICD-10-CM

## 2016-10-15 DIAGNOSIS — N390 Urinary tract infection, site not specified: Secondary | ICD-10-CM

## 2016-10-15 DIAGNOSIS — B9620 Unspecified Escherichia coli [E. coli] as the cause of diseases classified elsewhere: Secondary | ICD-10-CM

## 2016-10-16 ENCOUNTER — Other Ambulatory Visit (INDEPENDENT_AMBULATORY_CARE_PROVIDER_SITE_OTHER): Payer: BLUE CROSS/BLUE SHIELD

## 2016-10-16 DIAGNOSIS — N3000 Acute cystitis without hematuria: Secondary | ICD-10-CM

## 2016-10-16 DIAGNOSIS — N390 Urinary tract infection, site not specified: Secondary | ICD-10-CM

## 2016-10-16 DIAGNOSIS — B9620 Unspecified Escherichia coli [E. coli] as the cause of diseases classified elsewhere: Secondary | ICD-10-CM | POA: Diagnosis not present

## 2016-10-16 LAB — URINALYSIS, ROUTINE W REFLEX MICROSCOPIC
BILIRUBIN URINE: NEGATIVE
HGB URINE DIPSTICK: NEGATIVE
Ketones, ur: NEGATIVE
Leukocytes, UA: NEGATIVE
Nitrite: NEGATIVE
PH: 6 (ref 5.0–8.0)
RBC / HPF: NONE SEEN (ref 0–?)
Specific Gravity, Urine: 1.025 (ref 1.000–1.030)
Total Protein, Urine: NEGATIVE
Urine Glucose: NEGATIVE
Urobilinogen, UA: 0.2 (ref 0.0–1.0)

## 2016-10-18 ENCOUNTER — Ambulatory Visit: Payer: BLUE CROSS/BLUE SHIELD

## 2016-10-18 ENCOUNTER — Encounter: Payer: Self-pay | Admitting: Internal Medicine

## 2016-10-18 LAB — CULTURE, URINE COMPREHENSIVE

## 2017-01-15 ENCOUNTER — Encounter: Payer: Self-pay | Admitting: Internal Medicine

## 2017-01-16 MED ORDER — DICLOFENAC SODIUM 2 % TD SOLN: TRANSDERMAL | 0 refills | Status: DC

## 2017-01-21 ENCOUNTER — Encounter: Payer: Self-pay | Admitting: Internal Medicine

## 2017-01-21 ENCOUNTER — Other Ambulatory Visit (INDEPENDENT_AMBULATORY_CARE_PROVIDER_SITE_OTHER): Payer: BLUE CROSS/BLUE SHIELD

## 2017-01-21 ENCOUNTER — Other Ambulatory Visit: Payer: Self-pay | Admitting: Internal Medicine

## 2017-01-21 DIAGNOSIS — N3000 Acute cystitis without hematuria: Secondary | ICD-10-CM

## 2017-01-21 LAB — URINALYSIS, ROUTINE W REFLEX MICROSCOPIC
Bilirubin Urine: NEGATIVE
Hgb urine dipstick: NEGATIVE
KETONES UR: NEGATIVE
LEUKOCYTES UA: NEGATIVE
Nitrite: NEGATIVE
PH: 6 (ref 5.0–8.0)
RBC / HPF: NONE SEEN (ref 0–?)
SPECIFIC GRAVITY, URINE: 1.02 (ref 1.000–1.030)
TOTAL PROTEIN, URINE-UPE24: NEGATIVE
URINE GLUCOSE: NEGATIVE
UROBILINOGEN UA: 0.2 (ref 0.0–1.0)

## 2017-01-21 MED ORDER — SULFAMETHOXAZOLE-TRIMETHOPRIM 800-160 MG PO TABS: 1.0000 | ORAL_TABLET | Freq: Two times a day (BID) | ORAL | 0 refills | Status: DC

## 2017-01-23 ENCOUNTER — Encounter: Payer: Self-pay | Admitting: Internal Medicine

## 2017-01-23 ENCOUNTER — Other Ambulatory Visit: Payer: Self-pay | Admitting: Internal Medicine

## 2017-01-23 DIAGNOSIS — B9620 Unspecified Escherichia coli [E. coli] as the cause of diseases classified elsewhere: Secondary | ICD-10-CM

## 2017-01-23 DIAGNOSIS — N390 Urinary tract infection, site not specified: Secondary | ICD-10-CM

## 2017-01-23 LAB — CULTURE, URINE COMPREHENSIVE

## 2017-01-23 MED ORDER — NITROFURANTOIN MONOHYD MACRO 100 MG PO CAPS: 100.0000 mg | ORAL_CAPSULE | Freq: Two times a day (BID) | ORAL | 0 refills | Status: AC

## 2017-03-18 NOTE — Telephone Encounter (Signed)
error 

## 2017-04-16 ENCOUNTER — Other Ambulatory Visit: Payer: Self-pay | Admitting: Internal Medicine

## 2017-04-16 DIAGNOSIS — J301 Allergic rhinitis due to pollen: Secondary | ICD-10-CM

## 2017-04-22 LAB — HM PAP SMEAR

## 2017-06-26 ENCOUNTER — Other Ambulatory Visit: Payer: Self-pay | Admitting: Internal Medicine

## 2017-06-26 DIAGNOSIS — J0100 Acute maxillary sinusitis, unspecified: Secondary | ICD-10-CM

## 2017-06-26 MED ORDER — CEFDINIR 300 MG PO CAPS: 300.0000 mg | ORAL_CAPSULE | Freq: Two times a day (BID) | ORAL | 0 refills | Status: DC

## 2017-09-03 ENCOUNTER — Encounter: Payer: BLUE CROSS/BLUE SHIELD | Admitting: Internal Medicine

## 2017-09-10 ENCOUNTER — Other Ambulatory Visit (INDEPENDENT_AMBULATORY_CARE_PROVIDER_SITE_OTHER): Payer: BLUE CROSS/BLUE SHIELD

## 2017-09-10 ENCOUNTER — Ambulatory Visit (INDEPENDENT_AMBULATORY_CARE_PROVIDER_SITE_OTHER): Payer: BLUE CROSS/BLUE SHIELD | Admitting: Internal Medicine

## 2017-09-10 ENCOUNTER — Encounter: Payer: Self-pay | Admitting: Internal Medicine

## 2017-09-10 VITALS — BP 112/82 | HR 88 | Temp 98.5°F | Resp 16 | Ht 67.0 in | Wt 253.1 lb

## 2017-09-10 DIAGNOSIS — G43001 Migraine without aura, not intractable, with status migrainosus: Secondary | ICD-10-CM | POA: Insufficient documentation

## 2017-09-10 DIAGNOSIS — N390 Urinary tract infection, site not specified: Secondary | ICD-10-CM | POA: Diagnosis not present

## 2017-09-10 DIAGNOSIS — R6882 Decreased libido: Secondary | ICD-10-CM

## 2017-09-10 DIAGNOSIS — Z0000 Encounter for general adult medical examination without abnormal findings: Secondary | ICD-10-CM

## 2017-09-10 DIAGNOSIS — F411 Generalized anxiety disorder: Secondary | ICD-10-CM | POA: Diagnosis not present

## 2017-09-10 LAB — URINALYSIS, ROUTINE W REFLEX MICROSCOPIC
BILIRUBIN URINE: NEGATIVE
KETONES UR: NEGATIVE
LEUKOCYTES UA: NEGATIVE
NITRITE: NEGATIVE
PH: 6 (ref 5.0–8.0)
Specific Gravity, Urine: 1.025 (ref 1.000–1.030)
TOTAL PROTEIN, URINE-UPE24: NEGATIVE
Urine Glucose: NEGATIVE
Urobilinogen, UA: 0.2 (ref 0.0–1.0)

## 2017-09-10 MED ORDER — CLONAZEPAM 0.5 MG PO TABS: 0.5000 mg | ORAL_TABLET | Freq: Three times a day (TID) | ORAL | 0 refills | Status: DC | PRN

## 2017-09-10 MED ORDER — TOPIRAMATE 25 MG PO CPSP: 25.0000 mg | ORAL_CAPSULE | Freq: Two times a day (BID) | ORAL | 0 refills | Status: DC

## 2017-09-10 NOTE — Patient Instructions (Signed)
Preventive Care 18-39 Years, Female Preventive care refers to lifestyle choices and visits with your health care provider that can promote health and wellness. What does preventive care include?  A yearly physical exam. This is also called an annual well check.  Dental exams once or twice a year.  Routine eye exams. Ask your health care provider how often you should have your eyes checked.  Personal lifestyle choices, including: ? Daily care of your teeth and gums. ? Regular physical activity. ? Eating a healthy diet. ? Avoiding tobacco and drug use. ? Limiting alcohol use. ? Practicing safe sex. ? Taking vitamin and mineral supplements as recommended by your health care provider. What happens during an annual well check? The services and screenings done by your health care provider during your annual well check will depend on your age, overall health, lifestyle risk factors, and family history of disease. Counseling Your health care provider may ask you questions about your:  Alcohol use.  Tobacco use.  Drug use.  Emotional well-being.  Home and relationship well-being.  Sexual activity.  Eating habits.  Work and work Statistician.  Method of birth control.  Menstrual cycle.  Pregnancy history.  Screening You may have the following tests or measurements:  Height, weight, and BMI.  Diabetes screening. This is done by checking your blood sugar (glucose) after you have not eaten for a while (fasting).  Blood pressure.  Lipid and cholesterol levels. These may be checked every 5 years starting at age 66.  Skin check.  Hepatitis C blood test.  Hepatitis B blood test.  Sexually transmitted disease (STD) testing.  BRCA-related cancer screening. This may be done if you have a family history of breast, ovarian, tubal, or peritoneal cancers.  Pelvic exam and Pap test. This may be done every 3 years starting at age 59. Starting at age 56, this may be done every 5  years if you have a Pap test in combination with an HPV test.  Discuss your test results, treatment options, and if necessary, the need for more tests with your health care provider. Vaccines Your health care provider may recommend certain vaccines, such as:  Influenza vaccine. This is recommended every year.  Tetanus, diphtheria, and acellular pertussis (Tdap, Td) vaccine. You may need a Td booster every 10 years.  Varicella vaccine. You may need this if you have not been vaccinated.  HPV vaccine. If you are 46 or younger, you may need three doses over 6 months.  Measles, mumps, and rubella (MMR) vaccine. You may need at least one dose of MMR. You may also need a second dose.  Pneumococcal 13-valent conjugate (PCV13) vaccine. You may need this if you have certain conditions and were not previously vaccinated.  Pneumococcal polysaccharide (PPSV23) vaccine. You may need one or two doses if you smoke cigarettes or if you have certain conditions.  Meningococcal vaccine. One dose is recommended if you are age 60-21 years and a first-year college student living in a residence hall, or if you have one of several medical conditions. You may also need additional booster doses.  Hepatitis A vaccine. You may need this if you have certain conditions or if you travel or work in places where you may be exposed to hepatitis A.  Hepatitis B vaccine. You may need this if you have certain conditions or if you travel or work in places where you may be exposed to hepatitis B.  Haemophilus influenzae type b (Hib) vaccine. You may need this if  you have certain risk factors.  Talk to your health care provider about which screenings and vaccines you need and how often you need them. This information is not intended to replace advice given to you by your health care provider. Make sure you discuss any questions you have with your health care provider. Document Released: 10/29/2001 Document Revised: 05/22/2016  Document Reviewed: 07/04/2015 Elsevier Interactive Patient Education  [employer].

## 2017-09-10 NOTE — Progress Notes (Signed)
Subjective:  Patient ID: Maria Pennington, female    DOB: 12/02/1990  Age: 26 y.o. MRN: [id number]  CC: Annual Exam   HPI Maria Pennington presents for a CPX.  Complains of frequent UTIs.  She has seen her gynecologist about 3 or 4 times this year and was told to take an oral antibiotic after each sexual encounter.  She is not compliant with this as she forgets.  She wants to see a urologist to see if she needs a workup for recurrent urinary tract infections.  Additionally, she complains of low libido and wants to have her estrogen level checked.  She also complains of chronic, recurrent migraine headaches and wants to restart Topamax.   Additionally she occasionally has episodes of panic and anxiety and wants a refill on Klonopin. She is using a Financial risk analystXulane patch for contraception.  Outpatient Medications Prior to Visit  Medication Sig Dispense Refill  . norelgestromin-ethinyl estradiol Burr Medico(XULANE) 150-35 MCG/24HR transdermal patch Place 1 patch onto the skin once a week. 3 patch 12  . cefdinir (OMNICEF) 300 MG capsule Take 1 capsule (300 mg total) by mouth 2 (two) times daily. 20 capsule 0  . cyanocobalamin 2000 MCG tablet Take 1 tablet (2,000 mcg total) by mouth daily. 90 tablet 3  . dexlansoprazole (DEXILANT) 60 MG capsule Take 1 capsule (60 mg total) by mouth daily. 30 capsule 11  . Diclofenac Sodium (PENNSAID) 2 % SOLN Apply to affect area twice a day as needed (Patient not taking: Reported on 09/10/2017) 112 g 0  . levocetirizine (XYZAL) 5 MG tablet TAKE 1 TABLET BY MOUTH EVERY EVENING (Patient not taking: Reported on 09/10/2017) 90 tablet 1  . Levonorgestrel (SKYLA) 13.5 MG IUD 1 Act by Intrauterine route once. 1 Intra Uterine Device 0  . topiramate (TOPAMAX) 100 MG tablet Take 100 mg by mouth 2 (two) times daily.    Marland Kitchen. vortioxetine HBr (TRINTELLIX) 10 MG TABS Take 1 tablet (10 mg total) by mouth daily. 30 tablet 11   No facility-administered medications prior to visit.      ROS Review of Systems  Constitutional: Negative.  Negative for fatigue.  HENT: Negative.   Eyes: Negative.   Respiratory: Negative.  Negative for cough, chest tightness, shortness of breath and wheezing.   Cardiovascular: Negative for chest pain and leg swelling.  Gastrointestinal: Negative for abdominal pain, constipation, diarrhea, nausea and vomiting.  Endocrine: Negative.   Genitourinary: Negative.  Negative for decreased urine volume, difficulty urinating, dysuria, flank pain, frequency, hematuria, vaginal bleeding and vaginal discharge.  Musculoskeletal: Negative.   Skin: Negative.   Allergic/Immunologic: Negative.   Neurological: Positive for headaches. Negative for dizziness, speech difficulty, weakness and numbness.  Hematological: Negative for adenopathy. Does not bruise/bleed easily.  Psychiatric/Behavioral: Negative for confusion, decreased concentration, dysphoric mood, sleep disturbance and suicidal ideas. The patient is nervous/anxious.     Objective:  BP 112/82 (BP Location: Right Arm, Patient Position: Sitting, Cuff Size: Normal)   Pulse 88   Temp 98.5 F (36.9 C) (Oral)   Resp 16   Ht 5\' 7"  (1.702 m)   Wt 253 lb 1.3 oz (114.8 kg)   LMP 09/09/2017   SpO2 98%   BMI 39.64 kg/m   BP Readings from Last 3 Encounters:  09/10/17 112/82  10/07/16 110/80  07/24/16 98/78    Wt Readings from Last 3 Encounters:  09/10/17 253 lb 1.3 oz (114.8 kg)  10/07/16 228 lb (103.4 kg)  07/24/16 225 lb (102.1 kg)    Physical Exam  Constitutional: She is oriented to person, place, and time.  HENT:  Mouth/Throat: Oropharynx is clear and moist. No oropharyngeal exudate.  Eyes: Conjunctivae are normal. Left eye exhibits no discharge. No scleral icterus.  Neck: Normal range of motion. Neck supple. No JVD present. No thyromegaly present.  Cardiovascular: Normal rate, regular rhythm and normal heart sounds. Exam reveals no gallop.  No murmur heard. Pulmonary/Chest: Effort  normal and breath sounds normal. No respiratory distress. She has no wheezes. She has no rales.  Abdominal: Soft. Bowel sounds are normal. She exhibits no distension and no mass. There is no tenderness.  Musculoskeletal: Normal range of motion. She exhibits no edema, tenderness or deformity.  Lymphadenopathy:    She has no cervical adenopathy.  Neurological: She is alert and oriented to person, place, and time.  Skin: Skin is warm and dry. No rash noted. She is not diaphoretic. No erythema. No pallor.  Psychiatric: She has a normal mood and affect. Her behavior is normal. Judgment and thought content normal.  Vitals reviewed.   Lab Results  Component Value Date   WBC 7.8 10/07/2016   HGB 13.4 10/07/2016   HCT 39.3 10/07/2016   PLT 298.0 10/07/2016   GLUCOSE 90 10/07/2016   CHOL 132 10/10/2015   TRIG 61 10/10/2015   HDL 49 10/10/2015   LDLCALC 71 10/10/2015   ALT 11 10/07/2016   AST 12 10/07/2016   NA 138 10/07/2016   K 3.5 10/07/2016   CL 109 10/07/2016   CREATININE 0.64 10/07/2016   BUN 11 10/07/2016   CO2 21 10/07/2016   TSH 1.18 09/10/2017   INR 1.2 (H) 10/07/2016    Dg Lumbar Spine Complete  Result Date: 05/09/2016 CLINICAL DATA:  Low back pain, recent fall EXAM: LUMBAR SPINE - COMPLETE 4+ VIEW COMPARISON:  05/25/2013 FINDINGS: Five views of the lumbar spine submitted. No acute fracture or subluxation. Alignment, disc spaces and vertebral body heights are preserved. IMPRESSION: Negative. Electronically Signed   By: Natasha MeadLiviu  Pop M.D.   On: 05/09/2016 15:46    Assessment & Plan:   Maria Pennington was seen today for annual exam.  Diagnoses and all orders for this visit:  Recurrent UTI (urinary tract infection)- I will recheck her UA and culture and treat if indicated.  She will see urology for further evaluation. -     Urinalysis, Routine w reflex microscopic; Future -     CULTURE, URINE COMPREHENSIVE; Future -     Ambulatory referral to Urology  GAD (generalized anxiety  disorder) -     clonazePAM (KLONOPIN) 0.5 MG tablet; Take 1 tablet (0.5 mg total) by mouth 3 (three) times daily as needed for anxiety.  Low libido- I will check her TSH and estrogen level to see if these are a causative factor here. -     Thyroid Panel With TSH; Future -     Estradiol; Future  Migraine without aura and with status migrainosus, not intractable- Will restart Topamax for migraine prophylaxis. -     Discontinue: topiramate (TOPAMAX) 25 MG capsule; Take 1 capsule (25 mg total) by mouth 2 (two) times daily. -     topiramate (TOPAMAX) 25 MG tablet; Take 1 tablet (25 mg total) by mouth 2 (two) times daily.  Routine general medical examination at a health care facility- Exam completed, no labs indicated, vaccines reviewed and updated, patient education material was given.  Her Pap smear was updated earlier this year.   I have discontinued Valissa Homer's topiramate, Levonorgestrel, dexlansoprazole, vortioxetine HBr,  cyanocobalamin, Diclofenac Sodium, levocetirizine, cefdinir, and topiramate. I am also having her start on clonazePAM and topiramate. Additionally, I am having her maintain her norelgestromin-ethinyl estradiol.  Meds ordered this encounter  Medications  . DISCONTD: topiramate (TOPAMAX) 25 MG capsule    Sig: Take 1 capsule (25 mg total) by mouth 2 (two) times daily.    Dispense:  180 capsule    Refill:  0  . clonazePAM (KLONOPIN) 0.5 MG tablet    Sig: Take 1 tablet (0.5 mg total) by mouth 3 (three) times daily as needed for anxiety.    Dispense:  90 tablet    Refill:  0  . topiramate (TOPAMAX) 25 MG tablet    Sig: Take 1 tablet (25 mg total) by mouth 2 (two) times daily.    Dispense:  180 tablet    Refill:  0     Follow-up: Return if symptoms worsen or fail to improve.  Sanda Linger, MD

## 2017-09-11 ENCOUNTER — Encounter: Payer: Self-pay | Admitting: Internal Medicine

## 2017-09-11 DIAGNOSIS — Z0000 Encounter for general adult medical examination without abnormal findings: Secondary | ICD-10-CM | POA: Insufficient documentation

## 2017-09-11 LAB — CULTURE, URINE COMPREHENSIVE
MICRO NUMBER: 81448339
SPECIMEN QUALITY:: ADEQUATE

## 2017-09-11 MED ORDER — TOPIRAMATE 25 MG PO TABS: 25.0000 mg | ORAL_TABLET | Freq: Two times a day (BID) | ORAL | 0 refills | Status: DC

## 2017-09-12 ENCOUNTER — Encounter: Payer: Self-pay | Admitting: Internal Medicine

## 2017-09-15 ENCOUNTER — Other Ambulatory Visit: Payer: Self-pay | Admitting: Internal Medicine

## 2017-09-15 DIAGNOSIS — N390 Urinary tract infection, site not specified: Secondary | ICD-10-CM

## 2017-09-15 MED ORDER — SULFAMETHOXAZOLE-TRIMETHOPRIM 800-160 MG PO TABS: 1.0000 | ORAL_TABLET | Freq: Two times a day (BID) | ORAL | 0 refills | Status: AC

## 2017-09-16 ENCOUNTER — Encounter: Payer: Self-pay | Admitting: Internal Medicine

## 2017-09-18 LAB — THYROID PANEL WITH TSH
FREE THYROXINE INDEX: 1.9 (ref 1.4–3.8)
T3 Uptake: 28 % (ref 22–35)
T4 TOTAL: 6.7 ug/dL (ref 5.1–11.9)
TSH: 1.18 m[IU]/L

## 2017-09-18 LAB — ESTRADIOL, FREE
ESTRADIOL FREE: 0.34 pg/mL
Estradiol: 25 pg/mL

## 2017-09-19 ENCOUNTER — Encounter: Payer: Self-pay | Admitting: Internal Medicine

## 2017-09-22 ENCOUNTER — Other Ambulatory Visit: Payer: Self-pay | Admitting: Internal Medicine

## 2017-09-22 DIAGNOSIS — N390 Urinary tract infection, site not specified: Secondary | ICD-10-CM

## 2017-09-25 ENCOUNTER — Other Ambulatory Visit: Payer: Self-pay | Admitting: Internal Medicine

## 2017-09-25 DIAGNOSIS — E2839 Other primary ovarian failure: Secondary | ICD-10-CM

## 2017-09-30 ENCOUNTER — Encounter: Payer: Self-pay | Admitting: Internal Medicine

## 2017-10-01 NOTE — Progress Notes (Deleted)
10/02/2017 8:44 AM   Maria Pennington [birth year] [id number]  Referring provider: Janith Lima, MD [address], [place] [address]  No chief complaint on file.   HPI: Patient is a 27 -year-old *** female who is referred to [place] by Dr. Scarlette Calico for recurrent urinary tract infections.  Patient states that she has had *** urinary tract infections over the last year.  Reviewing her records,  she has had two documented UTI's.   One in 09/2016 and one in 01/2017.  Both for E. Coli   Her symptoms with a urinary tract infection consist of ***.  She denies/endorses dysuria, gross hematuria, suprapubic pain, back pain, abdominal pain or flank pain.***  She has not had any recent fevers, chills, nausea or vomiting. ***  She does/does not have a history of nephrolithiasis, GU surgery or GU trauma. ***  She is/is not sexually active.  She has/has not noted a correlation with her urinary tract infections and sexual intercourse.  ***   She does/does not engage in anal sex. ***  She is/ is not having anal to vaginal sex.*** She is/is not voiding before and after sex. ***     She is/is not postmenopausal. ***  She admits to/denies constipation and/or diarrhea. ***  She does/does not use tampons.  She does/does not engage in good perineal hygiene. She does/does not take tub baths. ***  She has/does not have incontinence.  She is using incontinence pads. ***  She is having/ not having pain with bladder filling.  ***  She has/not had any recent imaging studies.  ***  She is drinking *** of water daily.     Reviewed referral notes.    PMH: Past Medical History:  Diagnosis Date  . Allergic rhinitis   . Anxiety   . Depressed   . Eczema     Surgical History: Past Surgical History:  Procedure Laterality Date  . KNEE SURGERY Right 2009  . TONSILLECTOMY AND ADENOIDECTOMY      Home Medications:  Allergies as of 10/02/2017      Reactions   Relpax  [eletriptan] Palpitations   Triptans Palpitations      Medication List        Accurate as of 10/01/17  8:44 AM. Always use your most recent med list.          clonazePAM 0.5 MG tablet Commonly known as:  KLONOPIN Take 1 tablet (0.5 mg total) by mouth 3 (three) times daily as needed for anxiety.   topiramate 25 MG tablet Commonly known as:  TOPAMAX Take 1 tablet (25 mg total) by mouth 2 (two) times daily.   XULANE 150-35 MCG/24HR transdermal patch Generic drug:  norelgestromin-ethinyl estradiol Place 1 patch onto the skin once a week.       Allergies:  Allergies  Allergen Reactions  . Relpax [Eletriptan] Palpitations  . Triptans Palpitations    Family History: Family History  Problem Relation Age of Onset  . Colon cancer Neg Hx   . Colonic polyp Paternal Grandmother   . Colonic polyp Mother   . Heart disease Maternal Grandmother   . Ulcers Father     Social History:  reports that  has never smoked. she has never used smokeless tobacco. She reports that she does not drink alcohol or use drugs.  ROS:  Physical Exam: LMP 09/09/2017   Constitutional: Well nourished. Alert and oriented, No acute distress. HEENT: [place] AT, moist mucus membranes. Trachea midline, no masses. Cardiovascular: No clubbing, cyanosis, or edema. Respiratory: Normal respiratory effort, no increased work of breathing. GI: Abdomen is soft, non tender, non distended, no abdominal masses. Liver and spleen not palpable.  No hernias appreciated.  Stool sample for occult testing is not indicated.   GU: No CVA tenderness.  No bladder fullness or masses.  Normal external genitalia, normal pubic hair distribution, no lesions.  Normal urethral meatus, no lesions, no prolapse, no discharge.   No urethral masses, tenderness and/or tenderness. No bladder fullness, tenderness or masses. Normal vagina mucosa, good estrogen effect, no discharge, no  lesions, good pelvic support, no cystocele or rectocele noted.  No cervical motion tenderness.  Uterus is freely mobile and non-fixed.  No adnexal/parametria masses or tenderness noted.  Anus and perineum are without rashes or lesions.   *** Skin: No rashes, bruises or suspicious lesions. Lymph: No cervical or inguinal adenopathy. Neurologic: Grossly intact, no focal deficits, moving all 4 extremities. Psychiatric: Normal mood and affect.  Laboratory Data: Lab Results  Component Value Date   WBC 7.8 10/07/2016   HGB 13.4 10/07/2016   HCT 39.3 10/07/2016   MCV 85.7 10/07/2016   PLT 298.0 10/07/2016    Lab Results  Component Value Date   CREATININE 0.64 10/07/2016    No results found for: PSA  No results found for: TESTOSTERONE  No results found for: HGBA1C  Lab Results  Component Value Date   TSH 1.18 09/10/2017       Component Value Date/Time   CHOL 132 10/10/2015   HDL 49 10/10/2015   LDLCALC 71 10/10/2015    Lab Results  Component Value Date   AST 12 10/07/2016   Lab Results  Component Value Date   ALT 11 10/07/2016   No components found for: ALKALINEPHOPHATASE No components found for: BILIRUBINTOTAL  No results found for: ESTRADIOL  Urinalysis    Component Value Date/Time   COLORURINE YELLOW 09/10/2017 [place] 09/10/2017 1511   LABSPEC 1.025 09/10/2017 1511   PHURINE 6.0 09/10/2017 1511   GLUCOSEU NEGATIVE 09/10/2017 1511   HGBUR MODERATE (A) 09/10/2017 1511   BILIRUBINUR NEGATIVE 09/10/2017 1511   BILIRUBINUR neg 10/23/2015 1648   KETONESUR NEGATIVE 09/10/2017 1511   PROTEINUR 15 10/23/2015 1648   UROBILINOGEN 0.2 09/10/2017 1511   NITRITE NEGATIVE 09/10/2017 1511   LEUKOCYTESUR NEGATIVE 09/10/2017 1511    I have reviewed the labs.   Pertinent Imaging: *** I have independently reviewed the films.    Assessment & Plan:  ***  1. Recurrent UTI's  - criteria for recurrent UTI has been met with 2 or more infections in 6  months or 3 or greater infections in one year ***  - Patient is instructed to increase their water intake until the urine is pale yellow or clear (10 to 12 cups daily) ***  - probiotics (yogurt, oral pills or vaginal suppositories), take cranberry pills or drink the juice and Vitamin C 1,000 mg daily to acidify the urine should be added to their daily regimen ***  -. if using tampons, she should remove them prior to urinating and change them often ***  -avoid soaking in tubs and wipe front to back after urinating ***  - benefit from core strengthening exercises has been seen.  We can refer her to PT if they desire ***  - advised them  to have CATH UA's for urinalysis and culture to prevent skin contamination of the specimen  - reviewed symptoms of UTI and advised not to have urine checked or be treated for UTI if not experiencing symptoms  - discussed antibiotic stewardship with the patient                                                   No Follow-up on file.  These notes generated with voice recognition software. I apologize for typographical errors.  Zara Council, [facility] [address] [place], [place] [address] [phone]

## 2017-10-02 ENCOUNTER — Ambulatory Visit: Payer: Self-pay | Admitting: Urology

## 2017-10-30 ENCOUNTER — Ambulatory Visit: Payer: BLUE CROSS/BLUE SHIELD | Admitting: Urology

## 2017-10-30 ENCOUNTER — Encounter: Payer: Self-pay | Admitting: Urology

## 2017-11-20 ENCOUNTER — Encounter: Payer: Self-pay | Admitting: Internal Medicine

## 2017-11-21 MED ORDER — LEVOCETIRIZINE DIHYDROCHLORIDE 5 MG PO TABS: 5.0000 mg | ORAL_TABLET | Freq: Every evening | ORAL | 1 refills | Status: DC

## 2017-11-26 ENCOUNTER — Other Ambulatory Visit: Payer: Self-pay | Admitting: Internal Medicine

## 2018-01-22 ENCOUNTER — Telehealth: Payer: Self-pay

## 2018-01-22 ENCOUNTER — Other Ambulatory Visit: Payer: Self-pay | Admitting: Internal Medicine

## 2018-01-22 DIAGNOSIS — Z6833 Body mass index (BMI) 33.0-33.9, adult: Principal | ICD-10-CM

## 2018-01-22 DIAGNOSIS — E6609 Other obesity due to excess calories: Secondary | ICD-10-CM

## 2018-01-22 MED ORDER — PHENTERMINE HCL 37.5 MG PO TABS: 37.5000 mg | ORAL_TABLET | Freq: Every morning | ORAL | 1 refills | Status: DC

## 2018-01-22 NOTE — Telephone Encounter (Signed)
Pt is requesting refill of phentermine. Due around 01/26/2018. Please advise.  Will have patient schedule a follow up with you when you return from vacation.

## 2018-01-26 ENCOUNTER — Ambulatory Visit: Payer: BLUE CROSS/BLUE SHIELD | Admitting: Internal Medicine

## 2018-01-26 ENCOUNTER — Encounter: Payer: Self-pay | Admitting: Internal Medicine

## 2018-01-26 ENCOUNTER — Other Ambulatory Visit (INDEPENDENT_AMBULATORY_CARE_PROVIDER_SITE_OTHER): Payer: BLUE CROSS/BLUE SHIELD

## 2018-01-26 VITALS — BP 112/78 | HR 111 | Temp 98.6°F | Ht 67.0 in | Wt 247.0 lb

## 2018-01-26 DIAGNOSIS — G43001 Migraine without aura, not intractable, with status migrainosus: Secondary | ICD-10-CM | POA: Diagnosis not present

## 2018-01-26 DIAGNOSIS — Z113 Encounter for screening for infections with a predominantly sexual mode of transmission: Secondary | ICD-10-CM

## 2018-01-26 DIAGNOSIS — F411 Generalized anxiety disorder: Secondary | ICD-10-CM

## 2018-01-26 LAB — COMPREHENSIVE METABOLIC PANEL
ALK PHOS: 28 U/L — AB (ref 39–117)
ALT: 18 U/L (ref 0–35)
AST: 12 U/L (ref 0–37)
Albumin: 4.2 g/dL (ref 3.5–5.2)
BILIRUBIN TOTAL: 0.4 mg/dL (ref 0.2–1.2)
BUN: 9 mg/dL (ref 6–23)
CALCIUM: 9.2 mg/dL (ref 8.4–10.5)
CO2: 24 mEq/L (ref 19–32)
Chloride: 103 mEq/L (ref 96–112)
Creatinine, Ser: 0.64 mg/dL (ref 0.40–1.20)
GFR: 119.35 mL/min (ref >60.00)
Glucose, Bld: 92 mg/dL (ref 70–99)
POTASSIUM: 3.7 meq/L (ref 3.5–5.1)
SODIUM: 136 meq/L (ref 135–145)
TOTAL PROTEIN: 7.4 g/dL (ref 6.0–8.3)

## 2018-01-26 LAB — URINALYSIS, ROUTINE W REFLEX MICROSCOPIC
Bilirubin Urine: NEGATIVE
KETONES UR: NEGATIVE
LEUKOCYTES UA: NEGATIVE
Nitrite: NEGATIVE
URINE GLUCOSE: NEGATIVE
UROBILINOGEN UA: 0.2 (ref 0.0–1.0)
pH: 6 (ref 5.0–8.0)

## 2018-01-26 LAB — CBC
HEMATOCRIT: 39.7 % (ref 36.0–46.0)
Hemoglobin: 13.5 g/dL (ref 12.0–15.0)
MCHC: 34 g/dL (ref 30.0–36.0)
MCV: 84.7 fl (ref 78.0–100.0)
Platelets: 257 10*3/uL (ref 150.0–400.0)
RBC: 4.69 Mil/uL (ref 3.87–5.11)
RDW: 13.1 % (ref 11.5–15.5)
WBC: 10.4 10*3/uL (ref 4.0–10.5)

## 2018-01-26 MED ORDER — CLONAZEPAM 0.5 MG PO TABS: 0.5000 mg | ORAL_TABLET | Freq: Three times a day (TID) | ORAL | 0 refills | Status: DC | PRN

## 2018-01-26 MED ORDER — TOPIRAMATE 25 MG PO TABS: 25.0000 mg | ORAL_TABLET | Freq: Two times a day (BID) | ORAL | 3 refills | Status: DC

## 2018-01-26 MED ORDER — LEVOCETIRIZINE DIHYDROCHLORIDE 5 MG PO TABS: 5.0000 mg | ORAL_TABLET | Freq: Every evening | ORAL | 1 refills | Status: DC

## 2018-01-26 MED ORDER — NORELGESTROMIN-ETH ESTRADIOL 150-35 MCG/24HR TD PTWK: 1.0000 | MEDICATED_PATCH | TRANSDERMAL | 12 refills | Status: DC

## 2018-01-26 NOTE — Assessment & Plan Note (Signed)
Refill clonazepam and recent breakup causing flare of symptoms. She is encouraged to drink some ensure or boost if she is not eating at all and hydrate with fluids.

## 2018-01-26 NOTE — Progress Notes (Signed)
   Subjective:    Patient ID: Maria Pennington, female    DOB: 1991/03/30, 27 y.o.   MRN: [id number]  HPI The patient is a 27 YO female coming in for concerns about recent breakup. She is concerned about making sure she is clear due to circumstances of breakup. She denies discharge, burning, concerns about pregnancy (uses patch for birth control). She is having a lot of depression and anxiety this week since breakup. She is traveling for work and leaves town this week. She is not eating well, sleeping well. She is using tylenol pm for sleep with some benefit. Denies SI/HI. Has not been taking topamax but wants to resume as she is having more headaches in the last several weeks. Some may be related to eating patterns.   Review of Systems  Constitutional: Positive for appetite change and unexpected weight change. Negative for activity change, diaphoresis, fatigue and fever.  HENT: Negative.   Eyes: Negative.   Respiratory: Negative for cough, chest tightness and shortness of breath.   Cardiovascular: Negative for chest pain, palpitations and leg swelling.  Gastrointestinal: Negative for abdominal distention, abdominal pain, constipation, diarrhea, nausea and vomiting.  Musculoskeletal: Negative.   Skin: Negative.   Neurological: Negative.   Psychiatric/Behavioral: Negative.       Objective:   Physical Exam  Constitutional: She is oriented to person, place, and time. She appears well-developed and well-nourished.  HENT:  Head: Normocephalic and atraumatic.  Eyes: EOM are normal.  Neck: Normal range of motion.  Cardiovascular: Normal rate and regular rhythm.  Pulmonary/Chest: Effort normal and breath sounds normal. No respiratory distress. She has no wheezes. She has no rales.  Abdominal: Soft. Bowel sounds are normal. She exhibits no distension. There is no tenderness. There is no rebound.  Musculoskeletal: She exhibits no edema.  Neurological: She is alert and oriented to person, place,  and time. Coordination normal.  Skin: Skin is warm and dry.  Psychiatric: She has a normal mood and affect.   Vitals:   01/26/18 1519  BP: 112/78  Pulse: (!) 111  Temp: 98.6 F (37 C)  TempSrc: Oral  SpO2: 98%  Weight: 247 lb (112 kg)  Height:  (1.702 m)      Assessment & Plan:

## 2018-01-26 NOTE — Patient Instructions (Signed)
We will send in the refills for you and will check the labs.

## 2018-01-26 NOTE — Assessment & Plan Note (Signed)
Refill topamax 

## 2018-01-26 NOTE — Assessment & Plan Note (Signed)
Checking GC/chlamydia and HIV today. No symptoms but recent relationship status change.

## 2018-01-26 NOTE — Telephone Encounter (Signed)
erx sent by PCP on 01/22/2018

## 2018-01-27 ENCOUNTER — Other Ambulatory Visit: Payer: Self-pay | Admitting: Internal Medicine

## 2018-01-27 LAB — SPECIMEN STATUS REPORT

## 2018-01-27 LAB — HIV ANTIBODY (ROUTINE TESTING W REFLEX): HIV 1&2 Ab, 4th Generation: NONREACTIVE

## 2018-01-27 LAB — GC/CHLAMYDIA PROBE AMP
CHLAMYDIA, DNA PROBE: NEGATIVE
Neisseria gonorrhoeae by PCR: NEGATIVE

## 2018-01-27 MED ORDER — SULFAMETHOXAZOLE-TRIMETHOPRIM 800-160 MG PO TABS: 1.0000 | ORAL_TABLET | Freq: Two times a day (BID) | ORAL | 0 refills | Status: DC

## 2018-02-10 ENCOUNTER — Telehealth: Payer: Self-pay | Admitting: Internal Medicine

## 2018-02-10 DIAGNOSIS — IMO0001 Reserved for inherently not codable concepts without codable children: Secondary | ICD-10-CM

## 2018-02-10 NOTE — Telephone Encounter (Signed)
Copied from CRM [id number]. Topic: Referral - Request >> Feb 10, 2018  3:10 PM Lorrine Kin, [place] wrote: Reason for CRM: Requesting OB/Gyn referral in [place] for the following:  [facility] [address] [facility] [place], [place] (T) [phone] (F) [phone]  Patient travels for work and will be in [place] for the next 2-3 months. Would need the referral to get her birth Control while she is there.  CB#: [phone]

## 2018-02-10 NOTE — Telephone Encounter (Signed)
Referral has been entered.

## 2018-02-13 NOTE — Telephone Encounter (Signed)
Patient called checking on this referral. She said that she has a tight window of time that she is able to make an appointment and would like this sent as soon as possible. Please advise.

## 2018-02-16 NOTE — Telephone Encounter (Signed)
Referral has been faxed.

## 2018-05-05 ENCOUNTER — Telehealth: Payer: Self-pay | Admitting: Internal Medicine

## 2018-05-05 NOTE — Telephone Encounter (Signed)
Pt contacted and scheduled for 1:30pm on 05/13/2018

## 2018-05-05 NOTE — Telephone Encounter (Signed)
Okay to work in next week 

## 2018-05-05 NOTE — Telephone Encounter (Signed)
yes

## 2018-05-05 NOTE — Telephone Encounter (Signed)
Copied from CRM [phone]. Topic: Inquiry >> May 05, 2018  1:05 PM Darletta MollLander, [place] wrote: Reason for CRM: Patient would like a call back from [place]yl[place] to discuss being work in with Yetta BarreJones for a weight loss visit next week.

## 2018-05-13 ENCOUNTER — Other Ambulatory Visit (INDEPENDENT_AMBULATORY_CARE_PROVIDER_SITE_OTHER): Payer: BLUE CROSS/BLUE SHIELD

## 2018-05-13 ENCOUNTER — Encounter: Payer: Self-pay | Admitting: Internal Medicine

## 2018-05-13 ENCOUNTER — Ambulatory Visit: Payer: BLUE CROSS/BLUE SHIELD | Admitting: Internal Medicine

## 2018-05-13 VITALS — BP 116/80 | HR 133 | Temp 99.3°F | Resp 16 | Ht 66.8 in | Wt 252.2 lb

## 2018-05-13 DIAGNOSIS — Z7251 High risk heterosexual behavior: Secondary | ICD-10-CM

## 2018-05-13 DIAGNOSIS — R000 Tachycardia, unspecified: Secondary | ICD-10-CM | POA: Diagnosis not present

## 2018-05-13 DIAGNOSIS — R5383 Other fatigue: Secondary | ICD-10-CM | POA: Diagnosis not present

## 2018-05-13 DIAGNOSIS — R300 Dysuria: Secondary | ICD-10-CM

## 2018-05-13 DIAGNOSIS — E538 Deficiency of other specified B group vitamins: Secondary | ICD-10-CM

## 2018-05-13 DIAGNOSIS — Z01810 Encounter for preprocedural cardiovascular examination: Secondary | ICD-10-CM

## 2018-05-13 DIAGNOSIS — Z23 Encounter for immunization: Secondary | ICD-10-CM

## 2018-05-13 DIAGNOSIS — R7989 Other specified abnormal findings of blood chemistry: Secondary | ICD-10-CM

## 2018-05-13 LAB — COMPREHENSIVE METABOLIC PANEL
ALBUMIN: 4.2 g/dL (ref 3.5–5.2)
ALT: 11 U/L (ref 0–35)
AST: 12 U/L (ref 0–37)
Alkaline Phosphatase: 43 U/L (ref 39–117)
BUN: 11 mg/dL (ref 6–23)
CHLORIDE: 107 meq/L (ref 96–112)
CO2: 23 mEq/L (ref 19–32)
CREATININE: 0.68 mg/dL (ref 0.40–1.20)
Calcium: 9.5 mg/dL (ref 8.4–10.5)
GFR: 111.03 mL/min (ref >60.00)
GLUCOSE: 102 mg/dL — AB (ref 70–99)
Potassium: 3.8 mEq/L (ref 3.5–5.1)
Sodium: 138 mEq/L (ref 135–145)
TOTAL PROTEIN: 7.2 g/dL (ref 6.0–8.3)
Total Bilirubin: 0.5 mg/dL (ref 0.2–1.2)

## 2018-05-13 LAB — URINALYSIS, ROUTINE W REFLEX MICROSCOPIC
Bilirubin Urine: NEGATIVE
Ketones, ur: NEGATIVE
Leukocytes, UA: NEGATIVE
Nitrite: NEGATIVE
PH: 6.5 (ref 5.0–8.0)
SPECIFIC GRAVITY, URINE: 1.02 (ref 1.000–1.030)
Total Protein, Urine: NEGATIVE
UROBILINOGEN UA: 1 (ref 0.0–1.0)
Urine Glucose: NEGATIVE

## 2018-05-13 LAB — CBC WITH DIFFERENTIAL/PLATELET
BASOS PCT: 0.3 % (ref 0.0–3.0)
Basophils Absolute: 0 10*3/uL (ref 0.0–0.1)
EOS ABS: 0 10*3/uL (ref 0.0–0.7)
Eosinophils Relative: 0.5 % (ref 0.0–5.0)
HCT: 40.2 % (ref 36.0–46.0)
HEMOGLOBIN: 13.5 g/dL (ref 12.0–15.0)
LYMPHS ABS: 1.3 10*3/uL (ref 0.7–4.0)
Lymphocytes Relative: 17.2 % (ref 12.0–46.0)
MCHC: 33.6 g/dL (ref 30.0–36.0)
MCV: 86.5 fl (ref 78.0–100.0)
MONO ABS: 0.5 10*3/uL (ref 0.1–1.0)
Monocytes Relative: 6.8 % (ref 3.0–12.0)
NEUTROS PCT: 75.2 % (ref 43.0–77.0)
Neutro Abs: 5.6 10*3/uL (ref 1.4–7.7)
PLATELETS: 317 10*3/uL (ref 150.0–400.0)
RBC: 4.64 Mil/uL (ref 3.87–5.11)
RDW: 13.8 % (ref 11.5–15.5)
WBC: 7.4 10*3/uL (ref 4.0–10.5)

## 2018-05-13 LAB — VITAMIN B12: Vitamin B-12: 789 pg/mL (ref 211–911)

## 2018-05-13 LAB — FOLATE: Folate: 10.6 ng/mL (ref >5.9)

## 2018-05-13 NOTE — Progress Notes (Signed)
Subjective:  Patient ID: Maria RidingGabriella Lingo, female    DOB: 11/11/1990  Age: 27 y.o. MRN: [id number]  CC: Shortness of Breath   HPI Maria Pennington presents for f/up - she complains of a 3-week history of fatigue, shortness of breath, and mild swelling around her ankles.  She has been traveling long distances in a car for the past few months.  She is considering undergoing bariatric surgery.  She recently had a sexual encounter, unprotected, with a female and wants to be screened for STDs.  Outpatient Medications Prior to Visit  Medication Sig Dispense Refill  . clonazePAM (KLONOPIN) 0.5 MG tablet Take 1 tablet (0.5 mg total) by mouth 3 (three) times daily as needed for anxiety. 90 tablet 0  . levocetirizine (XYZAL) 5 MG tablet Take 1 tablet (5 mg total) by mouth every evening. 90 tablet 1  . Levonorgestrel (KYLEENA) 19.5 MG IUD by Intrauterine route.    . norelgestromin-ethinyl estradiol [employer]ico(XULANE) 150-35 MCG/24HR transdermal patch Place 1 patch onto the skin once a week. 3 Package 12  . phentermine (ADIPEX-P) 37.5 MG tablet Take 1 tablet (37.5 mg total) by mouth every morning. 30 tablet 1  . sulfamethoxazole-trimethoprim (BACTRIM DS,SEPTRA DS) 800-160 MG tablet Take 1 tablet by mouth 2 (two) times daily. 10 tablet 0  . topiramate (TOPAMAX) 25 MG tablet Take 1 tablet (25 mg total) by mouth 2 (two) times daily. 180 tablet 3   No facility-administered medications prior to visit.     ROS Review of Systems  Constitutional: Positive for fatigue. Negative for diaphoresis.  HENT: Negative.   Eyes: Negative for visual disturbance.  Respiratory: Positive for shortness of breath. Negative for cough and chest tightness.   Cardiovascular: Positive for leg swelling. Negative for chest pain and palpitations.  Gastrointestinal: Negative for abdominal pain, constipation, diarrhea, nausea and vomiting.  Endocrine: Negative.   Genitourinary: Negative.  Negative for decreased urine volume, difficulty  urinating, dysuria, flank pain, frequency, genital sores, hematuria, vaginal bleeding and vaginal discharge.  Musculoskeletal: Negative.  Negative for arthralgias and neck pain.  Skin: Negative.  Negative for color change.  Neurological: Negative.  Negative for dizziness, weakness and light-headedness.  Hematological: Negative for adenopathy. Does not bruise/bleed easily.  Psychiatric/Behavioral: Negative for dysphoric mood. The patient is nervous/anxious.     Objective:  BP 116/80 (BP Location: Left Arm, Patient Position: Sitting, Cuff Size: Large)   Pulse (!) 133   Temp 99.3 F (37.4 C) (Oral)   Resp 16   Ht 5' 6.8" (1.697 m)   Wt 252 lb 4 oz (114.4 kg)   LMP 05/12/2018 (Exact Date) Comment: Patient is irregular due to IUD  SpO2 97%   BMI 39.74 kg/m   BP Readings from Last 3 Encounters:  05/13/18 116/80  01/26/18 112/78  09/10/17 112/82    Wt Readings from Last 3 Encounters:  05/13/18 252 lb 4 oz (114.4 kg)  01/26/18 247 lb (112 kg)  09/10/17 253 lb 1.3 oz (114.8 kg)    Physical Exam  Constitutional: She is oriented to person, place, and time.  Non-toxic appearance. She does not appear ill.  HENT:  Mouth/Throat: Oropharynx is clear and moist.  Neck: Normal range of motion. Neck supple. No JVD present. No thyromegaly present.  Cardiovascular: Regular rhythm. Tachycardia present. Exam reveals no gallop.  No murmur heard. EKG ---  Sinus  Tachycardia  WITHIN NORMAL LIMITS  Pulmonary/Chest: Effort normal and breath sounds normal. No respiratory distress. She has no wheezes. She has no rales.  Abdominal:  Soft. Normal appearance and bowel sounds are normal. She exhibits no mass. There is no hepatosplenomegaly. There is no tenderness.  Genitourinary:  Genitourinary Comments: Pelvic exam deferred at her request  Musculoskeletal: Normal range of motion. She exhibits no edema, tenderness or deformity.  Neurological: She is alert and oriented to person, place, and time.  Skin:  Skin is warm and dry. No pallor.  Psychiatric: She has a normal mood and affect. Her behavior is normal. Judgment and thought content normal.    Lab Results  Component Value Date   WBC 7.4 05/13/2018   HGB 13.5 05/13/2018   HCT 40.2 05/13/2018   PLT 317.0 05/13/2018   GLUCOSE 102 (H) 05/13/2018   CHOL 132 10/10/2015   TRIG 61 10/10/2015   HDL 49 10/10/2015   LDLCALC 71 10/10/2015   ALT 11 05/13/2018   AST 12 05/13/2018   NA 138 05/13/2018   K 3.8 05/13/2018   CL 107 05/13/2018   CREATININE 0.68 05/13/2018   BUN 11 05/13/2018   CO2 23 05/13/2018   TSH 0.84 05/13/2018   INR 1.2 (H) 10/07/2016    Dg Lumbar Spine Complete  Result Date: 05/09/2016 CLINICAL DATA:  Low back pain, recent fall EXAM: LUMBAR SPINE - COMPLETE 4+ VIEW COMPARISON:  05/25/2013 FINDINGS: Five views of the lumbar spine submitted. No acute fracture or subluxation. Alignment, disc spaces and vertebral body heights are preserved. IMPRESSION: Negative. Electronically Signed   By: Natasha Mead M.D.   On: 05/09/2016 15:46    Assessment & Plan:   Maria Pennington was seen today for shortness of breath.  Diagnoses and all orders for this visit:  Preop cardiovascular exam -     EKG 12-Lead  Need for influenza vaccination -     Flu Vaccine QUAD 36+ mos IM  Tachycardia- She has a 3-week history of fatigue and shortness of breath,.  EKG shows tachycardia.  She has a few risk factors for DVT and PE.  Her d-dimer is mildly elevated.  Her labs are negative for any other causes of her symptoms.  I have asked her to undergo a CT angio to screen for pulmonary embolus. -     Comprehensive metabolic panel; Future -     Thyroid Panel With TSH; Future -     D-dimer, quantitative (not at [facility]); Future -     CT Angio Chest W/Cm &/Or Wo Cm; Future  Other fatigue -     Comprehensive metabolic panel; Future -     Thyroid Panel With TSH; Future  B12 deficiency- Her B12 level is normal -     CBC with Differential/Platelet; Future -      Folate; Future -     Vitamin B12; Future  High risk heterosexual behavior- Will screen for STIs. -     RPR; Future -     HIV antibody; Future -     Cancel: GC/Chlamydia Probe Amp; Future -     GC/Chlamydia Probe Amp; Future  Dysuria -     Urinalysis, Routine w reflex microscopic; Future  D-dimer, elevated -     CT Angio Chest W/Cm &/Or Wo Cm; Future   I have discontinued Nea Sonier's phentermine, norelgestromin-ethinyl estradiol, topiramate, and sulfamethoxazole-trimethoprim. I am also having her maintain her clonazePAM, levocetirizine, and Levonorgestrel.  No orders of the defined types were placed in this encounter.    Follow-up: Return if symptoms worsen or fail to improve.  Sanda Linger, MD

## 2018-05-13 NOTE — Patient Instructions (Signed)

## 2018-05-14 ENCOUNTER — Encounter (HOSPITAL_BASED_OUTPATIENT_CLINIC_OR_DEPARTMENT_OTHER): Payer: Self-pay

## 2018-05-14 ENCOUNTER — Ambulatory Visit (HOSPITAL_BASED_OUTPATIENT_CLINIC_OR_DEPARTMENT_OTHER)
Admission: RE | Admit: 2018-05-14 | Discharge: 2018-05-14 | Disposition: A | Discharge status: Home / Self Care | Payer: BLUE CROSS/BLUE SHIELD | Source: Ambulatory Visit | Attending: Internal Medicine | Admitting: Internal Medicine

## 2018-05-14 ENCOUNTER — Other Ambulatory Visit: Payer: BLUE CROSS/BLUE SHIELD

## 2018-05-14 ENCOUNTER — Encounter: Payer: Self-pay | Admitting: Internal Medicine

## 2018-05-14 DIAGNOSIS — R300 Dysuria: Secondary | ICD-10-CM | POA: Insufficient documentation

## 2018-05-14 DIAGNOSIS — R000 Tachycardia, unspecified: Secondary | ICD-10-CM | POA: Diagnosis present

## 2018-05-14 DIAGNOSIS — R7989 Other specified abnormal findings of blood chemistry: Secondary | ICD-10-CM | POA: Insufficient documentation

## 2018-05-14 LAB — D-DIMER, QUANTITATIVE: D-Dimer, Quant: 0.9 mcg/mL FEU — ABNORMAL HIGH (ref <0.50)

## 2018-05-14 LAB — THYROID PANEL WITH TSH
Free Thyroxine Index: 2.3 (ref 1.4–3.8)
T3 UPTAKE: 32 % (ref 22–35)
T4 TOTAL: 7.3 ug/dL (ref 5.1–11.9)
TSH: 0.84 mIU/L

## 2018-05-14 LAB — HIV ANTIBODY (ROUTINE TESTING W REFLEX): HIV: NONREACTIVE

## 2018-05-14 LAB — RPR: RPR: NONREACTIVE

## 2018-05-14 MED ORDER — IOPAMIDOL (ISOVUE-370) INJECTION 76%
100.0000 mL | Freq: Once | INTRAVENOUS | Status: AC | PRN
  Administered 2018-05-14: 100 mL via INTRAVENOUS

## 2018-05-15 LAB — URINE CULTURE
MICRO NUMBER:: 91035495
Result:: NO GROWTH
SPECIMEN QUALITY:: ADEQUATE

## 2018-05-16 ENCOUNTER — Encounter: Payer: Self-pay | Admitting: Internal Medicine

## 2018-05-16 LAB — GC/CHLAMYDIA PROBE AMP
CHLAMYDIA, DNA PROBE: NEGATIVE
Neisseria gonorrhoeae by PCR: NEGATIVE

## 2018-05-19 ENCOUNTER — Ambulatory Visit: Payer: BLUE CROSS/BLUE SHIELD | Admitting: Internal Medicine

## 2018-09-22 ENCOUNTER — Encounter: Payer: Self-pay | Admitting: Internal Medicine

## 2018-09-22 ENCOUNTER — Other Ambulatory Visit: Payer: Self-pay | Admitting: Internal Medicine

## 2018-09-22 DIAGNOSIS — L2084 Intrinsic (allergic) eczema: Secondary | ICD-10-CM | POA: Insufficient documentation

## 2018-09-22 MED ORDER — TRIAMCINOLONE ACETONIDE 0.5 % EX OINT: 1.0000 "application " | TOPICAL_OINTMENT | Freq: Two times a day (BID) | CUTANEOUS | 1 refills | Status: DC

## 2018-12-25 ENCOUNTER — Other Ambulatory Visit: Payer: Self-pay | Admitting: Internal Medicine

## 2018-12-25 DIAGNOSIS — N760 Acute vaginitis: Secondary | ICD-10-CM

## 2018-12-25 DIAGNOSIS — B9689 Other specified bacterial agents as the cause of diseases classified elsewhere: Secondary | ICD-10-CM

## 2018-12-25 DIAGNOSIS — B373 Candidiasis of vulva and vagina: Secondary | ICD-10-CM

## 2018-12-25 DIAGNOSIS — B3731 Acute candidiasis of vulva and vagina: Secondary | ICD-10-CM

## 2018-12-25 DIAGNOSIS — J301 Allergic rhinitis due to pollen: Secondary | ICD-10-CM

## 2018-12-25 MED ORDER — FLUCONAZOLE 150 MG PO TABS: 300.0000 mg | ORAL_TABLET | Freq: Once | ORAL | 0 refills | Status: AC

## 2018-12-25 MED ORDER — METRONIDAZOLE 500 MG PO TABS: 500.0000 mg | ORAL_TABLET | Freq: Two times a day (BID) | ORAL | 0 refills | Status: AC

## 2018-12-25 MED ORDER — LEVOCETIRIZINE DIHYDROCHLORIDE 5 MG PO TABS: 5.0000 mg | ORAL_TABLET | Freq: Every evening | ORAL | 1 refills | Status: DC

## 2019-02-04 ENCOUNTER — Other Ambulatory Visit: Payer: Self-pay

## 2019-02-04 ENCOUNTER — Encounter: Payer: Self-pay | Admitting: Internal Medicine

## 2019-02-04 ENCOUNTER — Other Ambulatory Visit (INDEPENDENT_AMBULATORY_CARE_PROVIDER_SITE_OTHER): Payer: BLUE CROSS/BLUE SHIELD

## 2019-02-04 ENCOUNTER — Encounter: Payer: BLUE CROSS/BLUE SHIELD | Admitting: Internal Medicine

## 2019-02-04 ENCOUNTER — Ambulatory Visit (INDEPENDENT_AMBULATORY_CARE_PROVIDER_SITE_OTHER): Payer: BLUE CROSS/BLUE SHIELD | Admitting: Internal Medicine

## 2019-02-04 VITALS — BP 130/80 | HR 96 | Temp 97.7°F | Resp 16 | Ht 66.8 in | Wt 243.0 lb

## 2019-02-04 DIAGNOSIS — R300 Dysuria: Secondary | ICD-10-CM | POA: Diagnosis not present

## 2019-02-04 DIAGNOSIS — F411 Generalized anxiety disorder: Secondary | ICD-10-CM | POA: Diagnosis not present

## 2019-02-04 DIAGNOSIS — E538 Deficiency of other specified B group vitamins: Secondary | ICD-10-CM

## 2019-02-04 DIAGNOSIS — E6609 Other obesity due to excess calories: Secondary | ICD-10-CM | POA: Diagnosis not present

## 2019-02-04 DIAGNOSIS — R739 Hyperglycemia, unspecified: Secondary | ICD-10-CM

## 2019-02-04 DIAGNOSIS — Z6833 Body mass index (BMI) 33.0-33.9, adult: Secondary | ICD-10-CM | POA: Diagnosis not present

## 2019-02-04 DIAGNOSIS — J301 Allergic rhinitis due to pollen: Secondary | ICD-10-CM

## 2019-02-04 LAB — BASIC METABOLIC PANEL
BUN: 12 mg/dL (ref 6–23)
CO2: 22 mEq/L (ref 19–32)
Calcium: 9.1 mg/dL (ref 8.4–10.5)
Chloride: 104 mEq/L (ref 96–112)
Creatinine, Ser: 0.59 mg/dL (ref 0.40–1.20)
GFR: 122.38 mL/min (ref >60.00)
Glucose, Bld: 111 mg/dL — ABNORMAL HIGH (ref 70–99)
Potassium: 4 mEq/L (ref 3.5–5.1)
Sodium: 136 mEq/L (ref 135–145)

## 2019-02-04 LAB — URINALYSIS, ROUTINE W REFLEX MICROSCOPIC
Bilirubin Urine: NEGATIVE
Hgb urine dipstick: NEGATIVE
Ketones, ur: NEGATIVE
Leukocytes,Ua: NEGATIVE
Nitrite: NEGATIVE
Specific Gravity, Urine: 1.025 (ref 1.000–1.030)
Total Protein, Urine: NEGATIVE
Urine Glucose: NEGATIVE
Urobilinogen, UA: 0.2 (ref 0.0–1.0)
pH: 6 (ref 5.0–8.0)

## 2019-02-04 LAB — CBC WITH DIFFERENTIAL/PLATELET
Basophils Absolute: 0 10*3/uL (ref 0.0–0.1)
Basophils Relative: 0.5 % (ref 0.0–3.0)
Eosinophils Absolute: 0.1 10*3/uL (ref 0.0–0.7)
Eosinophils Relative: 1.4 % (ref 0.0–5.0)
HCT: 40.7 % (ref 36.0–46.0)
Hemoglobin: 14 g/dL (ref 12.0–15.0)
Lymphocytes Relative: 24.8 % (ref 12.0–46.0)
Lymphs Abs: 1.8 10*3/uL (ref 0.7–4.0)
MCHC: 34.4 g/dL (ref 30.0–36.0)
MCV: 87 fl (ref 78.0–100.0)
Monocytes Absolute: 0.5 10*3/uL (ref 0.1–1.0)
Monocytes Relative: 6.4 % (ref 3.0–12.0)
Neutro Abs: 4.8 10*3/uL (ref 1.4–7.7)
Neutrophils Relative %: 66.9 % (ref 43.0–77.0)
Platelets: 325 10*3/uL (ref 150.0–400.0)
RBC: 4.68 Mil/uL (ref 3.87–5.11)
RDW: 13.5 % (ref 11.5–15.5)
WBC: 7.2 10*3/uL (ref 4.0–10.5)

## 2019-02-04 LAB — TSH: TSH: 0.79 u[IU]/mL (ref 0.35–4.50)

## 2019-02-04 LAB — VITAMIN B12: Vitamin B-12: 426 pg/mL (ref 211–911)

## 2019-02-04 LAB — HEMOGLOBIN A1C: Hgb A1c MFr Bld: 5.4 % (ref 4.6–6.5)

## 2019-02-04 LAB — FOLATE: Folate: 14.2 ng/mL (ref >5.9)

## 2019-02-04 MED ORDER — CLONAZEPAM 0.5 MG PO TABS: 0.5000 mg | ORAL_TABLET | Freq: Three times a day (TID) | ORAL | 1 refills | Status: DC | PRN

## 2019-02-04 MED ORDER — LEVOCETIRIZINE DIHYDROCHLORIDE 5 MG PO TABS: 5.0000 mg | ORAL_TABLET | Freq: Every evening | ORAL | 1 refills | Status: DC

## 2019-02-04 NOTE — Progress Notes (Signed)
Subjective:  Patient ID: Maria Pennington, female    DOB: 04-03-1991  Age: 28 y.o. MRN: [id number]  CC: Follow-up   HPI Maria Pennington presents for she complains of intermittent episodes of anxiety and panic.  She has run out of Klonopin and asked for refill.  She is not willing to take an antidepressant like an SSRI.  She denies signs and symptoms of depression.  Outpatient Medications Prior to Visit  Medication Sig Dispense Refill  . Levonorgestrel (KYLEENA) 19.5 MG IUD by Intrauterine route.    . triamcinolone ointment (KENALOG) 0.5 % Apply 1 application topically 2 (two) times daily. 60 g 1  . clonazePAM (KLONOPIN) 0.5 MG tablet Take 1 tablet (0.5 mg total) by mouth 3 (three) times daily as needed for anxiety. 90 tablet 0  . levocetirizine (XYZAL) 5 MG tablet Take 1 tablet (5 mg total) by mouth every evening. 90 tablet 1   No facility-administered medications prior to visit.     ROS Review of Systems  Constitutional: Negative for diaphoresis, fatigue and unexpected weight change.  HENT: Positive for congestion and rhinorrhea.   Eyes: Negative for visual disturbance.  Respiratory: Negative for cough, shortness of breath and wheezing.   Cardiovascular: Negative for chest pain, palpitations and leg swelling.  Gastrointestinal: Negative for abdominal pain, constipation, diarrhea, nausea and vomiting.  Endocrine: Negative.   Genitourinary: Positive for dysuria. Negative for decreased urine volume, difficulty urinating, frequency, hematuria, urgency, vaginal bleeding, vaginal discharge and vaginal pain.  Musculoskeletal: Negative.  Negative for arthralgias and myalgias.  Skin: Negative.  Negative for color change.  Neurological: Negative.  Negative for dizziness and light-headedness.  Hematological: Negative for adenopathy. Does not bruise/bleed easily.  Psychiatric/Behavioral: Positive for sleep disturbance. Negative for agitation, behavioral problems, confusion, decreased  concentration, dysphoric mood, self-injury and suicidal ideas. The patient is nervous/anxious.     Objective:  BP 130/80 (BP Location: Left Arm, Patient Position: Sitting, Cuff Size: Large)   Pulse 96   Temp 97.7 F (36.5 C) (Oral)   Resp 16   Ht 5' 6.8" (1.697 m)   Wt 243 lb (110.2 kg)   LMP 02/01/2019   SpO2 98%   BMI 38.29 kg/m   BP Readings from Last 3 Encounters:  02/04/19 130/80  05/13/18 116/80  01/26/18 112/78    Wt Readings from Last 3 Encounters:  02/04/19 243 lb (110.2 kg)  05/13/18 252 lb 4 oz (114.4 kg)  01/26/18 247 lb (112 kg)    Physical Exam Vitals signs reviewed.  Constitutional:      Appearance: She is not ill-appearing or diaphoretic.  HENT:     Nose: Nose normal.     Mouth/Throat:     Mouth: Mucous membranes are moist.     Pharynx: No oropharyngeal exudate.  Eyes:     General: No scleral icterus.    Conjunctiva/sclera: Conjunctivae normal.  Neck:     Musculoskeletal: Normal range of motion and neck supple.  Cardiovascular:     Rate and Rhythm: Normal rate and regular rhythm.     Heart sounds: No murmur.  Pulmonary:     Effort: Pulmonary effort is normal.     Breath sounds: No stridor. No wheezing, rhonchi or rales.  Abdominal:     General: Abdomen is flat.     Palpations: There is no mass.     Tenderness: There is no abdominal tenderness. There is no guarding.  Musculoskeletal: Normal range of motion.        General: No  swelling.  Lymphadenopathy:     Cervical: No cervical adenopathy.  Skin:    General: Skin is warm and dry.  Neurological:     General: No focal deficit present.  Psychiatric:        Attention and Perception: Attention and perception normal.        Mood and Affect: Mood and affect normal. Mood is not anxious.        Speech: Speech normal. Speech is not delayed.        Behavior: Behavior normal. Behavior is not agitated. Behavior is cooperative.        Thought Content: Thought content is not paranoid or delusional.  Thought content does not include homicidal or suicidal ideation.     Lab Results  Component Value Date   WBC 7.2 02/04/2019   HGB 14.0 02/04/2019   HCT 40.7 02/04/2019   PLT 325.0 02/04/2019   GLUCOSE 111 (H) 02/04/2019   CHOL 132 10/10/2015   TRIG 61 10/10/2015   HDL 49 10/10/2015   LDLCALC 71 10/10/2015   ALT 11 05/13/2018   AST 12 05/13/2018   NA 136 02/04/2019   K 4.0 02/04/2019   CL 104 02/04/2019   CREATININE 0.59 02/04/2019   BUN 12 02/04/2019   CO2 22 02/04/2019   TSH 0.79 02/04/2019   INR 1.2 (H) 10/07/2016   HGBA1C 5.4 02/04/2019    Ct Angio Chest W/cm &/or Wo Cm  Result Date: 05/14/2018 CLINICAL DATA:  28 year old female with acute shortness of breath for 3 weeks, elevated D-dimer and fatigue for 2 months. EXAM: CT ANGIOGRAPHY CHEST WITH CONTRAST TECHNIQUE: Multidetector CT imaging of the chest was performed using the standard protocol during bolus administration of intravenous contrast. Multiplanar CT image reconstructions and MIPs were obtained to evaluate the vascular anatomy. CONTRAST:  100mL ISOVUE-370 IOPAMIDOL (ISOVUE-370) INJECTION 76% COMPARISON:  None. FINDINGS: Cardiovascular: The study is technically adequate. No pulmonary emboli are identified. UPPER limits normal heart size noted. No thoracic aortic aneurysm or pericardial effusion. Mediastinum/Nodes: No enlarged mediastinal, hilar, or axillary lymph nodes. Thyroid gland, trachea, and esophagus demonstrate no significant findings. Lungs/Pleura: No airspace disease, consolidation, mass, suspicious nodule, pleural effusion or pneumothorax. Upper Abdomen: No acute abnormality Musculoskeletal: No acute or suspicious bony abnormality. Review of the MIP images confirms the above findings. IMPRESSION: 1. UPPER limits normal heart size without other significant abnormality. No evidence of pulmonary emboli. Electronically Signed   By: Harmon PierJeffrey  Hu M.D.   On: 05/14/2018 14:52    Assessment & Plan:   Maria Pennington was seen  today for follow-up.  Diagnoses and all orders for this visit:  GAD (generalized anxiety disorder) -     Discontinue: clonazePAM (KLONOPIN) 0.5 MG tablet; Take 1 tablet (0.5 mg total) by mouth 3 (three) times daily as needed for anxiety. -     clonazePAM (KLONOPIN) 0.5 MG tablet; Take 1 tablet (0.5 mg total) by mouth 3 (three) times daily as needed for anxiety.  B12 deficiency- Her B12 level is normal now.  She is not taking a B12 supplement and a B12 supplement is not indicated. -     CBC with Differential/Platelet; Future -     Vitamin B12; Future -     Folate; Future  Class 1 obesity due to excess calories without serious comorbidity with body mass index (BMI) of 33.0 to 33.9 in adult -     TSH; Future  Hyperglycemia -     Basic metabolic panel; Future -     Hemoglobin  A1c; Future  Dysuria- Her urinalysis is normal.  She will let me know if she develops any new or worsening symptoms. -     Urinalysis, Routine w reflex microscopic; Future  Seasonal allergic rhinitis due to pollen -     levocetirizine (XYZAL) 5 MG tablet; Take 1 tablet (5 mg total) by mouth every evening.   I am having Maria Pennington maintain her Levonorgestrel, triamcinolone ointment, levocetirizine, and clonazePAM.  Meds ordered this encounter  Medications  . DISCONTD: clonazePAM (KLONOPIN) 0.5 MG tablet    Sig: Take 1 tablet (0.5 mg total) by mouth 3 (three) times daily as needed for anxiety.    Dispense:  90 tablet    Refill:  1  . levocetirizine (XYZAL) 5 MG tablet    Sig: Take 1 tablet (5 mg total) by mouth every evening.    Dispense:  90 tablet    Refill:  1  . clonazePAM (KLONOPIN) 0.5 MG tablet    Sig: Take 1 tablet (0.5 mg total) by mouth 3 (three) times daily as needed for anxiety.    Dispense:  90 tablet    Refill:  1     Follow-up: No follow-ups on file.  Sanda Linger, MD

## 2019-04-15 LAB — HM PAP SMEAR

## 2019-06-07 ENCOUNTER — Other Ambulatory Visit: Payer: Self-pay

## 2019-06-07 ENCOUNTER — Other Ambulatory Visit (INDEPENDENT_AMBULATORY_CARE_PROVIDER_SITE_OTHER): Payer: BC Managed Care – PPO

## 2019-06-07 ENCOUNTER — Encounter: Payer: Self-pay | Admitting: Internal Medicine

## 2019-06-07 ENCOUNTER — Ambulatory Visit (INDEPENDENT_AMBULATORY_CARE_PROVIDER_SITE_OTHER): Payer: BC Managed Care – PPO | Admitting: Internal Medicine

## 2019-06-07 VITALS — BP 108/70 | HR 90 | Temp 97.6°F | Resp 16 | Ht 66.8 in | Wt 255.0 lb

## 2019-06-07 DIAGNOSIS — F5104 Psychophysiologic insomnia: Secondary | ICD-10-CM | POA: Insufficient documentation

## 2019-06-07 DIAGNOSIS — R0683 Snoring: Secondary | ICD-10-CM | POA: Diagnosis not present

## 2019-06-07 DIAGNOSIS — R300 Dysuria: Secondary | ICD-10-CM | POA: Diagnosis not present

## 2019-06-07 MED ORDER — DAYVIGO 5 MG PO TABS: 1.0000 | ORAL_TABLET | Freq: Every evening | ORAL | 0 refills | Status: DC | PRN

## 2019-06-07 MED FILL — clonazePAM 0.5 MG TABS: 0.5 | 30 days supply | Qty: 90 | Fill #0

## 2019-06-07 NOTE — Patient Instructions (Signed)

## 2019-06-07 NOTE — Progress Notes (Signed)
Subjective:  Patient ID: Maria Pennington, female    DOB: 05-07-1991  Age: 28 y.o. MRN: [id number]  CC: Snoring   HPI Maria Pennington presents for f/up - Her boyfriend complains about her snoring and she wants to undergo a sleep study.  She also complains of dysuria and wants to know if she has a bladder infection.  He complains of insomnia with difficulty falling asleep and difficulty staying asleep.  She thinks the insomnia is contributing to her weight gain.  She complains of next-day fatigue and lethargy.  Outpatient Medications Prior to Visit  Medication Sig Dispense Refill  . clonazePAM (KLONOPIN) 0.5 MG tablet Take 1 tablet (0.5 mg total) by mouth 3 (three) times daily as needed for anxiety. 90 tablet 1  . levocetirizine (XYZAL) 5 MG tablet Take 1 tablet (5 mg total) by mouth every evening. 90 tablet 1  . Levonorgestrel (KYLEENA) 19.5 MG IUD by Intrauterine route.    . triamcinolone ointment (KENALOG) 0.5 % Apply 1 application topically 2 (two) times daily. 60 g 1   No facility-administered medications prior to visit.     ROS Review of Systems  Constitutional: Positive for unexpected weight change (wt gain). Negative for chills, diaphoresis, fatigue and fever.  HENT: Negative.   Eyes: Negative.   Respiratory: Negative.  Negative for cough, chest tightness, shortness of breath and wheezing.   Cardiovascular: Negative for chest pain, palpitations and leg swelling.  Gastrointestinal: Negative for abdominal pain, constipation, diarrhea, nausea and vomiting.  Genitourinary: Positive for dysuria. Negative for decreased urine volume, difficulty urinating, flank pain, hematuria, urgency, vaginal bleeding and vaginal discharge.  Musculoskeletal: Negative for arthralgias and myalgias.  Skin: Negative.  Negative for color change and pallor.  Neurological: Negative.  Negative for dizziness, weakness and light-headedness.  Hematological: Negative for adenopathy. Does not bruise/bleed  easily.  Psychiatric/Behavioral: Positive for sleep disturbance. Negative for dysphoric mood and suicidal ideas. The patient is not nervous/anxious.     Objective:  BP 108/70 (BP Location: Left Arm, Patient Position: Sitting, Cuff Size: Large)   Pulse 90   Temp 97.6 F (36.4 C) (Oral)   Resp 16   Ht 5' 6.8" (1.697 m)   Wt 255 lb (115.7 kg)   SpO2 99%   BMI 40.18 kg/m   BP Readings from Last 3 Encounters:  06/07/19 108/70  02/04/19 130/80  05/13/18 116/80    Wt Readings from Last 3 Encounters:  06/07/19 255 lb (115.7 kg)  02/04/19 243 lb (110.2 kg)  05/13/18 252 lb 4 oz (114.4 kg)    Physical Exam Vitals signs reviewed.  Constitutional:      Appearance: She is obese.  HENT:     Nose: Nose normal.     Mouth/Throat:     Mouth: Mucous membranes are moist.  Eyes:     General: No scleral icterus.    Conjunctiva/sclera: Conjunctivae normal.  Neck:     Musculoskeletal: Normal range of motion. No muscular tenderness.  Cardiovascular:     Rate and Rhythm: Normal rate and regular rhythm.     Heart sounds: No murmur.  Pulmonary:     Effort: Pulmonary effort is normal.     Breath sounds: No stridor. No wheezing, rhonchi or rales.  Abdominal:     General: Abdomen is protuberant. Bowel sounds are normal.     Palpations: There is no hepatomegaly or splenomegaly.     Tenderness: There is no abdominal tenderness.  Musculoskeletal: Normal range of motion.  Right lower leg: No edema.     Left lower leg: No edema.  Lymphadenopathy:     Cervical: No cervical adenopathy.  Skin:    General: Skin is warm and dry.  Neurological:     General: No focal deficit present.     Mental Status: She is alert.     Lab Results  Component Value Date   WBC 7.2 02/04/2019   HGB 14.0 02/04/2019   HCT 40.7 02/04/2019   PLT 325.0 02/04/2019   GLUCOSE 111 (H) 02/04/2019   CHOL 132 10/10/2015   TRIG 61 10/10/2015   HDL 49 10/10/2015   LDLCALC 71 10/10/2015   ALT 11 05/13/2018   AST  12 05/13/2018   NA 136 02/04/2019   K 4.0 02/04/2019   CL 104 02/04/2019   CREATININE 0.59 02/04/2019   BUN 12 02/04/2019   CO2 22 02/04/2019   TSH 0.79 02/04/2019   INR 1.2 (H) 10/07/2016   HGBA1C 5.4 02/04/2019    Ct Angio Chest W/cm &/or Wo Cm  Result Date: 05/14/2018 CLINICAL DATA:  28 year old female with acute shortness of breath for 3 weeks, elevated D-dimer and fatigue for 2 months. EXAM: CT ANGIOGRAPHY CHEST WITH CONTRAST TECHNIQUE: Multidetector CT imaging of the chest was performed using the standard protocol during bolus administration of intravenous contrast. Multiplanar CT image reconstructions and MIPs were obtained to evaluate the vascular anatomy. CONTRAST:  149mL ISOVUE-370 IOPAMIDOL (ISOVUE-370) INJECTION 76% COMPARISON:  None. FINDINGS: Cardiovascular: The study is technically adequate. No pulmonary emboli are identified. UPPER limits normal heart size noted. No thoracic aortic aneurysm or pericardial effusion. Mediastinum/Nodes: No enlarged mediastinal, hilar, or axillary lymph nodes. Thyroid gland, trachea, and esophagus demonstrate no significant findings. Lungs/Pleura: No airspace disease, consolidation, mass, suspicious nodule, pleural effusion or pneumothorax. Upper Abdomen: No acute abnormality Musculoskeletal: No acute or suspicious bony abnormality. Review of the MIP images confirms the above findings. IMPRESSION: 1. UPPER limits normal heart size without other significant abnormality. No evidence of pulmonary emboli. Electronically Signed   By: Margarette Canada M.D.   On: 05/14/2018 14:52    Assessment & Plan:   Angelli was seen today for snoring.  Diagnoses and all orders for this visit:  Dysuria- Her urine culture is positive for an insignificant growth of gram-positive organisms.  Screening for gonococcal area and chlamydia is negative.  I do not think a course of antibiotics is indicated. -     Cancel: Urinalysis, Routine w reflex microscopic; Future -      Cancel: CULTURE, URINE COMPREHENSIVE; Future -     CULTURE, URINE COMPREHENSIVE; Future -     Urinalysis, Routine w reflex microscopic; Future -     GC/Chlamydia Probe Amp; Future  Snoring -     Cancel: Ambulatory referral to Sleep Studies -     Ambulatory referral to Sleep Studies  Psychophysiological insomnia -     Lemborexant (DAYVIGO) 5 MG TABS; Take 1 tablet by mouth at bedtime as needed.   I am having Toyia E. Swartzlander start on DayVigo. I am also having her maintain her Levonorgestrel, triamcinolone ointment, levocetirizine, and clonazePAM.  Meds ordered this encounter  Medications  . Lemborexant (DAYVIGO) 5 MG TABS    Sig: Take 1 tablet by mouth at bedtime as needed.    Dispense:  10 tablet    Refill:  0     Follow-up: Return in about 3 months (around 09/06/2019).  Scarlette Calico, MD

## 2019-06-08 LAB — URINALYSIS, ROUTINE W REFLEX MICROSCOPIC
Bilirubin Urine: NEGATIVE
Hgb urine dipstick: NEGATIVE
Ketones, ur: NEGATIVE
Leukocytes,Ua: NEGATIVE
Nitrite: NEGATIVE
Specific Gravity, Urine: 1.025 (ref 1.000–1.030)
Total Protein, Urine: NEGATIVE
Urine Glucose: NEGATIVE
Urobilinogen, UA: 0.2 (ref 0.0–1.0)
pH: 5.5 (ref 5.0–8.0)

## 2019-06-09 LAB — CULTURE, URINE COMPREHENSIVE
MICRO NUMBER:: 903619
SPECIMEN QUALITY:: ADEQUATE

## 2019-06-10 ENCOUNTER — Encounter: Payer: Self-pay | Admitting: Internal Medicine

## 2019-06-10 LAB — GC/CHLAMYDIA PROBE AMP
Chlamydia trachomatis, NAA: NEGATIVE
Neisseria Gonorrhoeae by PCR: NEGATIVE

## 2019-06-17 ENCOUNTER — Encounter: Payer: Self-pay | Admitting: Internal Medicine

## 2019-07-20 ENCOUNTER — Encounter: Payer: Self-pay | Admitting: Internal Medicine

## 2019-07-20 ENCOUNTER — Other Ambulatory Visit: Payer: Self-pay | Admitting: Internal Medicine

## 2019-07-20 DIAGNOSIS — J301 Allergic rhinitis due to pollen: Secondary | ICD-10-CM

## 2019-07-20 MED ORDER — LEVOCETIRIZINE DIHYDROCHLORIDE 5 MG PO TABS: 5.0000 mg | ORAL_TABLET | Freq: Every evening | ORAL | 1 refills | Status: DC

## 2019-08-05 ENCOUNTER — Encounter: Payer: Self-pay | Admitting: Internal Medicine

## 2019-08-06 ENCOUNTER — Other Ambulatory Visit: Payer: Self-pay | Admitting: Internal Medicine

## 2019-08-06 DIAGNOSIS — L2084 Intrinsic (allergic) eczema: Secondary | ICD-10-CM

## 2019-08-06 DIAGNOSIS — M170 Bilateral primary osteoarthritis of knee: Secondary | ICD-10-CM | POA: Insufficient documentation

## 2019-08-06 MED ORDER — DICLOFENAC SODIUM 1 % EX GEL: 2.0000 g | Freq: Four times a day (QID) | CUTANEOUS | 2 refills | Status: AC

## 2019-08-06 MED ORDER — TRIAMCINOLONE ACETONIDE 0.5 % EX OINT: 1.0000 "application " | TOPICAL_OINTMENT | Freq: Two times a day (BID) | CUTANEOUS | 2 refills | Status: AC

## 2020-01-21 ENCOUNTER — Other Ambulatory Visit: Payer: Self-pay | Admitting: Internal Medicine

## 2020-01-21 DIAGNOSIS — F411 Generalized anxiety disorder: Secondary | ICD-10-CM

## 2020-01-21 MED FILL — LEVOCETIRIZINE 5 MG TABLET: 5 | 90 days supply | Qty: 90 | Fill #0

## 2020-01-21 MED FILL — clonazePAM 0.5 MG TABS: 0.5 | 30 days supply | Qty: 90 | Fill #0

## 2020-01-24 ENCOUNTER — Ambulatory Visit: Payer: BC Managed Care – PPO | Admitting: Internal Medicine

## 2020-06-28 ENCOUNTER — Encounter: Payer: BC Managed Care – PPO | Admitting: Internal Medicine

## 2020-07-20 IMAGING — CT CT ANGIO CHEST
2 of 8 series · 19 of 36 positions shown · IV contrast (iopamidol)
Comparison: None.

CLINICAL DATA: 27-year-old female with acute shortness of breath
for 3 weeks, elevated D-dimer and fatigue for 2 months.

EXAM:
CT ANGIOGRAPHY CHEST WITH CONTRAST
TECHNIQUE: Multidetector CT imaging of the chest was performed using the
standard protocol during bolus administration of intravenous
contrast. Multiplanar CT image reconstructions and MIPs were
obtained to evaluate the vascular anatomy.
CONTRAST:  100mL SY7NB3-X9C IOPAMIDOL (SY7NB3-X9C) INJECTION 76%

[Series 6: pe coronal mpr · coronal · 0.52mm/px · 1 of 101 slices shown]
[im 51/101  mediastinal]
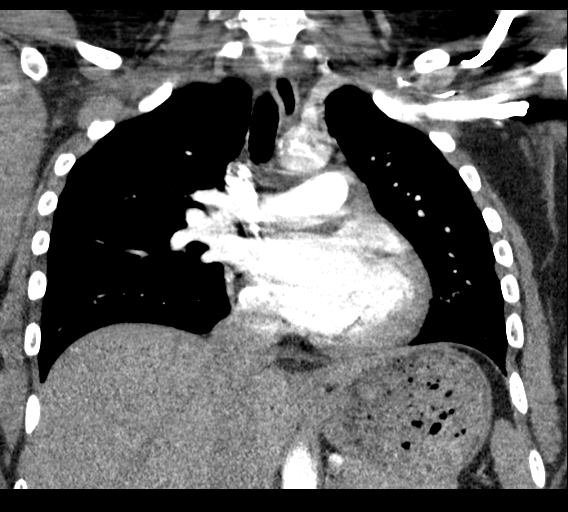

[Series 10: pe thins · axial · 0.66mm/px · z∈[-251,-30]mm · 18 of 249 slices shown]
[im 14/249  lung]
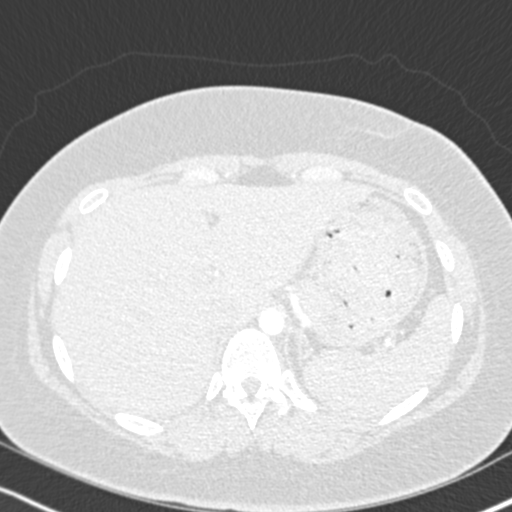
[im 27/249  mediastinal]
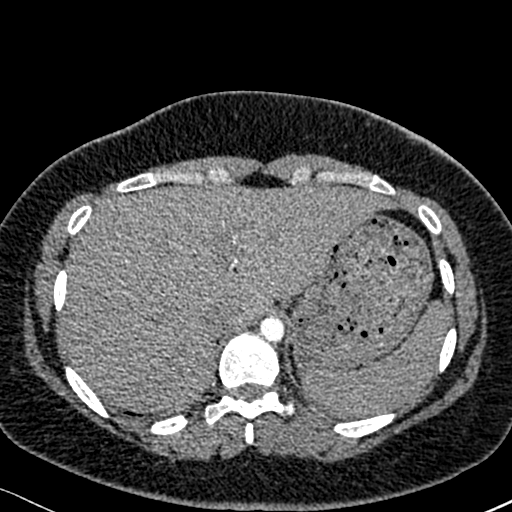
[im 40/249  lung]
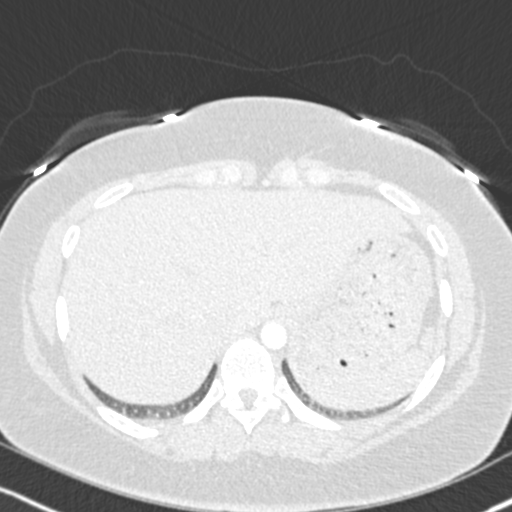
[im 53/249  mediastinal]
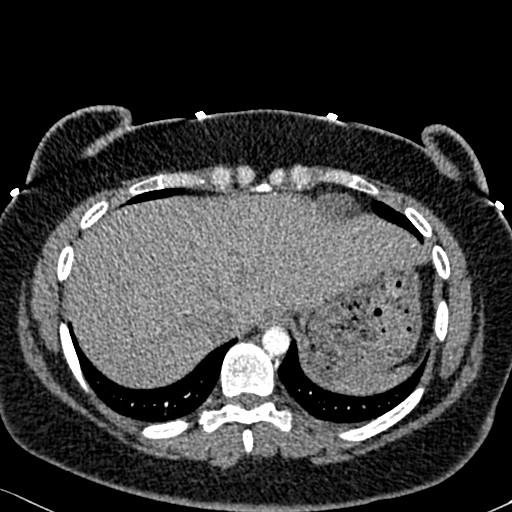
[im 66/249  lung]
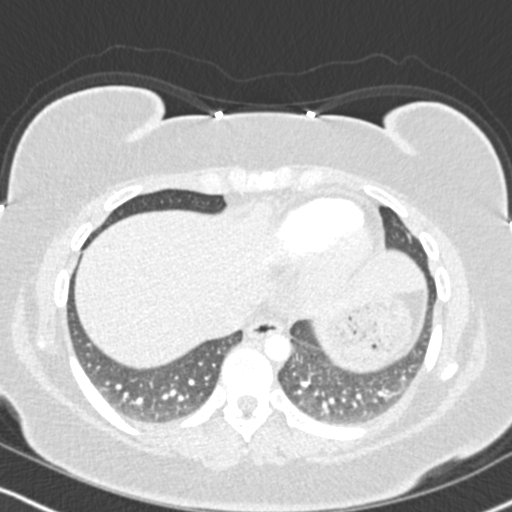
[im 79/249  mediastinal]
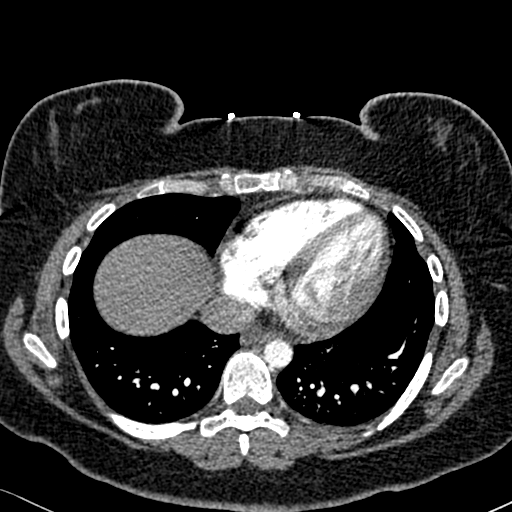
[im 92/249  lung]
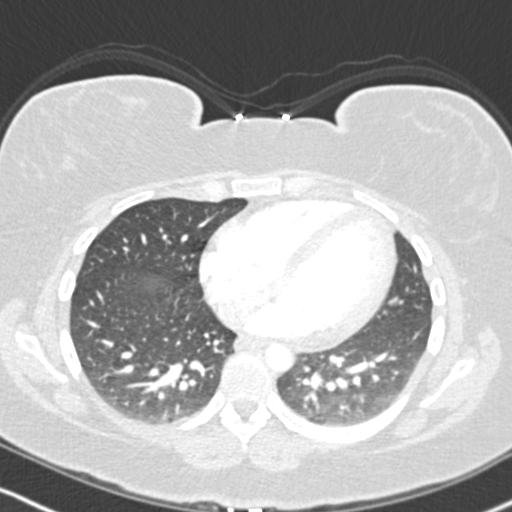
[im 105/249  mediastinal]
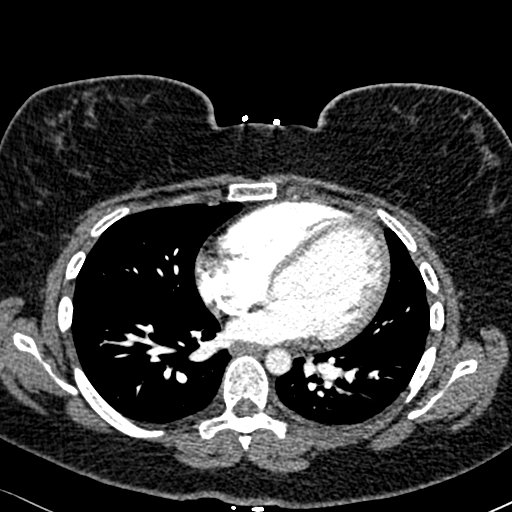
[im 118/249  lung]
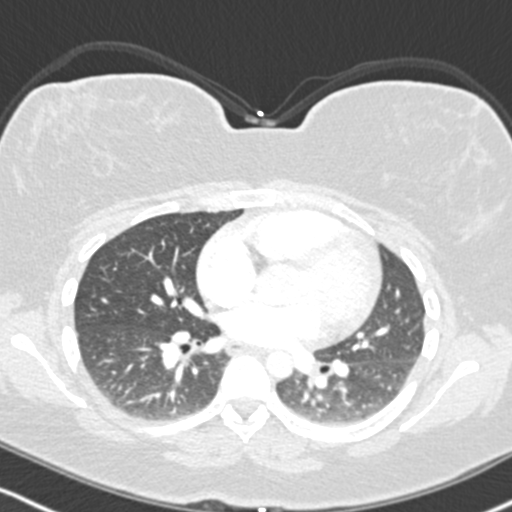
[im 131/249  mediastinal]
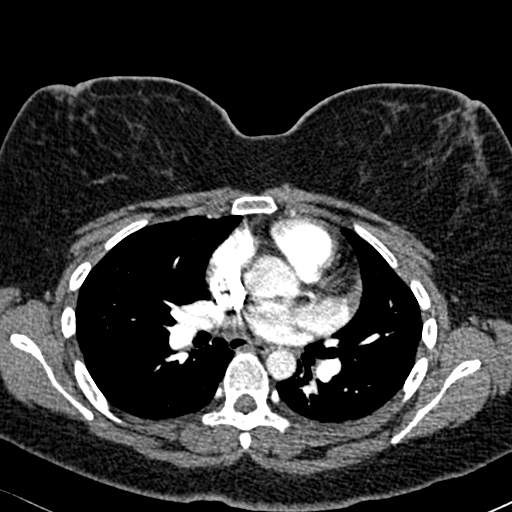
[im 144/249  lung]
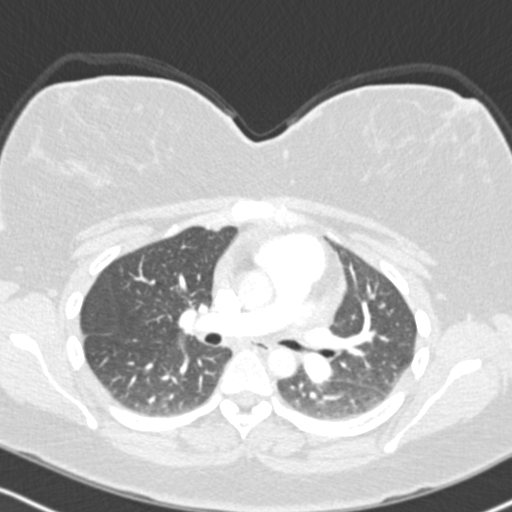
[im 157/249  mediastinal]
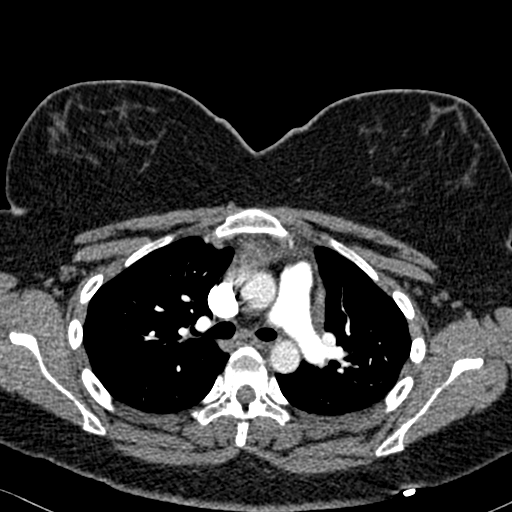
[im 170/249  lung]
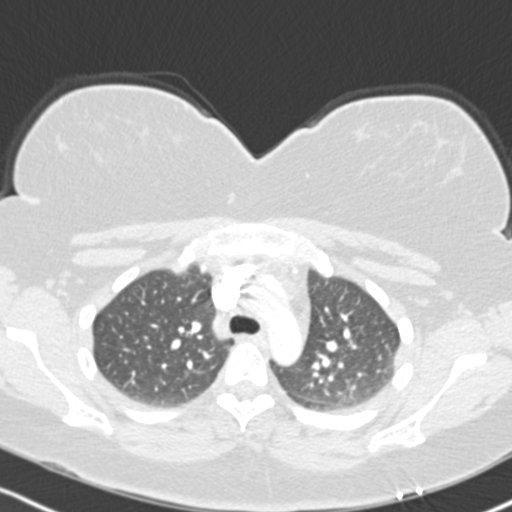
[im 183/249  mediastinal]
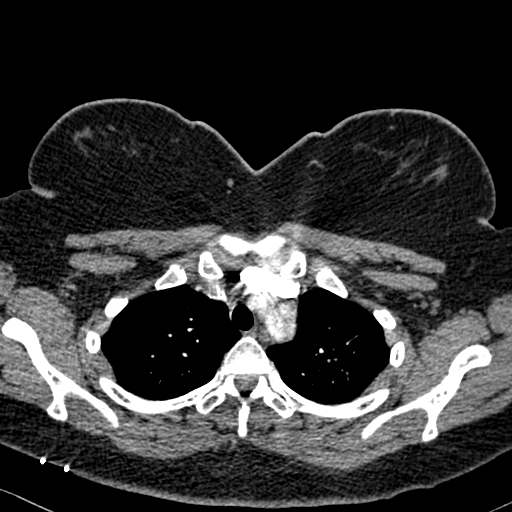
[im 196/249  lung]
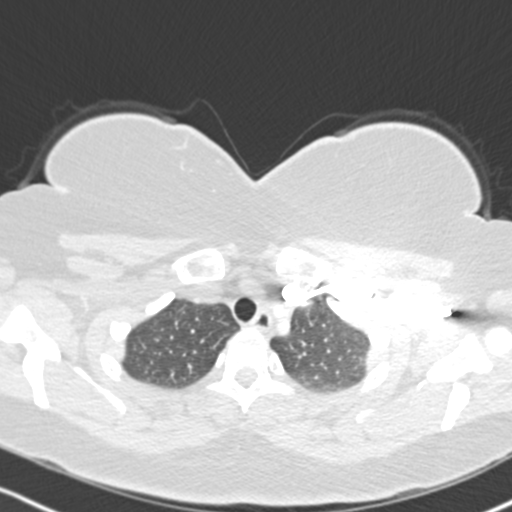
[im 209/249  mediastinal]
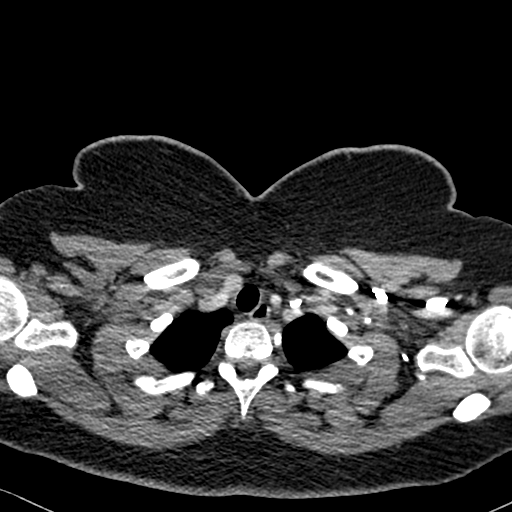
[im 222/249  lung]
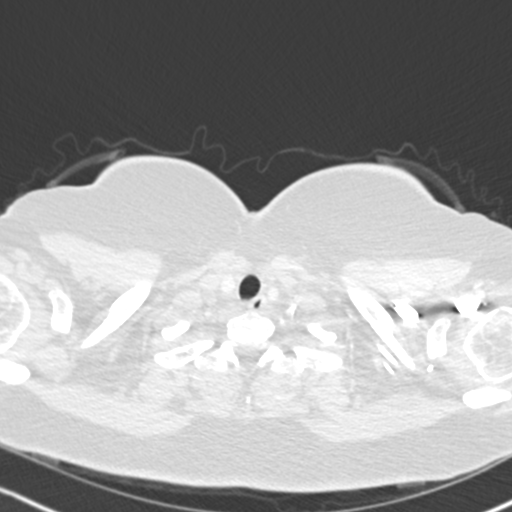
[im 235/249  mediastinal]
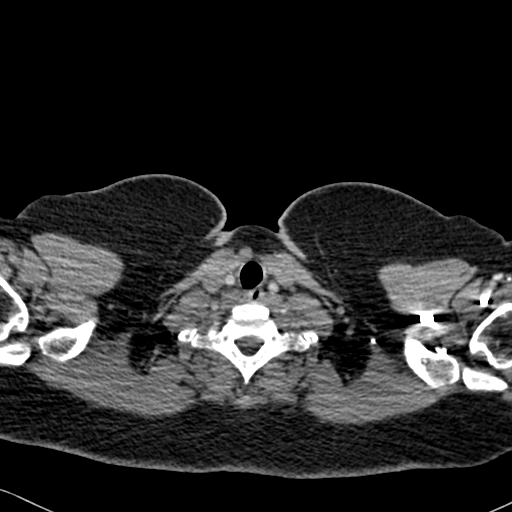

[19 of 36 positions shown; findings below may reference images not displayed]

FINDINGS: Cardiovascular: The study is technically adequate. No pulmonary
emboli are identified. UPPER limits normal heart size noted. No
thoracic aortic aneurysm or pericardial effusion.

Mediastinum/Nodes: No enlarged mediastinal, hilar, or axillary lymph
nodes. Thyroid gland, trachea, and esophagus demonstrate no
significant findings.

Lungs/Pleura: No airspace disease, consolidation, mass, suspicious
nodule, pleural effusion or pneumothorax.

Upper Abdomen: No acute abnormality

Musculoskeletal: No acute or suspicious bony abnormality.

Review of the MIP images confirms the above findings.
IMPRESSION: 1. UPPER limits normal heart size without other significant
abnormality. No evidence of pulmonary emboli.

## 2020-07-31 ENCOUNTER — Other Ambulatory Visit: Payer: Self-pay

## 2020-07-31 ENCOUNTER — Ambulatory Visit (INDEPENDENT_AMBULATORY_CARE_PROVIDER_SITE_OTHER): Payer: BC Managed Care – PPO

## 2020-07-31 ENCOUNTER — Encounter: Payer: Self-pay | Admitting: Internal Medicine

## 2020-07-31 ENCOUNTER — Ambulatory Visit (INDEPENDENT_AMBULATORY_CARE_PROVIDER_SITE_OTHER): Payer: BC Managed Care – PPO | Admitting: Internal Medicine

## 2020-07-31 VITALS — BP 124/80 | HR 96 | Temp 98.4°F | Ht 67.0 in | Wt 283.2 lb

## 2020-07-31 DIAGNOSIS — Z0000 Encounter for general adult medical examination without abnormal findings: Secondary | ICD-10-CM | POA: Diagnosis not present

## 2020-07-31 DIAGNOSIS — Z01818 Encounter for other preprocedural examination: Secondary | ICD-10-CM

## 2020-07-31 DIAGNOSIS — E538 Deficiency of other specified B group vitamins: Secondary | ICD-10-CM | POA: Diagnosis not present

## 2020-07-31 DIAGNOSIS — Z6841 Body Mass Index (BMI) 40.0 and over, adult: Secondary | ICD-10-CM

## 2020-07-31 DIAGNOSIS — F5104 Psychophysiologic insomnia: Secondary | ICD-10-CM

## 2020-07-31 DIAGNOSIS — F411 Generalized anxiety disorder: Secondary | ICD-10-CM

## 2020-07-31 DIAGNOSIS — J301 Allergic rhinitis due to pollen: Secondary | ICD-10-CM | POA: Diagnosis not present

## 2020-07-31 DIAGNOSIS — F331 Major depressive disorder, recurrent, moderate: Secondary | ICD-10-CM | POA: Diagnosis not present

## 2020-07-31 DIAGNOSIS — E559 Vitamin D deficiency, unspecified: Secondary | ICD-10-CM

## 2020-07-31 LAB — CBC WITH DIFFERENTIAL/PLATELET
Basophils Absolute: 0 10*3/uL (ref 0.0–0.1)
Basophils Relative: 0.2 % (ref 0.0–3.0)
Eosinophils Absolute: 0 10*3/uL (ref 0.0–0.7)
Eosinophils Relative: 0.5 % (ref 0.0–5.0)
HCT: 39.2 % (ref 36.0–46.0)
Hemoglobin: 12.9 g/dL (ref 12.0–15.0)
Lymphocytes Relative: 19.4 % (ref 12.0–46.0)
Lymphs Abs: 1.7 10*3/uL (ref 0.7–4.0)
MCHC: 33 g/dL (ref 30.0–36.0)
MCV: 85.3 fl (ref 78.0–100.0)
Monocytes Absolute: 0.7 10*3/uL (ref 0.1–1.0)
Monocytes Relative: 7.8 % (ref 3.0–12.0)
Neutro Abs: 6.2 10*3/uL (ref 1.4–7.7)
Neutrophils Relative %: 72.1 % (ref 43.0–77.0)
Platelets: 334 10*3/uL (ref 150.0–400.0)
RBC: 4.59 Mil/uL (ref 3.87–5.11)
RDW: 13.6 % (ref 11.5–15.5)
WBC: 8.6 10*3/uL (ref 4.0–10.5)

## 2020-07-31 LAB — BASIC METABOLIC PANEL
BUN: 13 mg/dL (ref 6–23)
CO2: 26 mEq/L (ref 19–32)
Calcium: 9.1 mg/dL (ref 8.4–10.5)
Chloride: 106 mEq/L (ref 96–112)
Creatinine, Ser: 0.66 mg/dL (ref 0.40–1.20)
GFR: 119.42 mL/min (ref >60.00)
Glucose, Bld: 96 mg/dL (ref 70–99)
Potassium: 3.8 mEq/L (ref 3.5–5.1)
Sodium: 138 mEq/L (ref 135–145)

## 2020-07-31 LAB — HEPATIC FUNCTION PANEL
ALT: 14 U/L (ref 0–35)
AST: 14 U/L (ref 0–37)
Albumin: 4 g/dL (ref 3.5–5.2)
Alkaline Phosphatase: 46 U/L (ref 39–117)
Bilirubin, Direct: 0 mg/dL (ref 0.0–0.3)
Total Bilirubin: 0.4 mg/dL (ref 0.2–1.2)
Total Protein: 6.9 g/dL (ref 6.0–8.3)

## 2020-07-31 LAB — TSH: TSH: 1.54 u[IU]/mL (ref 0.35–4.50)

## 2020-07-31 LAB — VITAMIN D 25 HYDROXY (VIT D DEFICIENCY, FRACTURES): VITD: 10.45 ng/mL — ABNORMAL LOW (ref 30.00–100.00)

## 2020-07-31 LAB — LIPID PANEL
Cholesterol: 171 mg/dL (ref 0–200)
HDL: 47.6 mg/dL (ref >39.00)
LDL Cholesterol: 102 mg/dL — ABNORMAL HIGH (ref 0–99)
NonHDL: 123.56
Total CHOL/HDL Ratio: 4
Triglycerides: 108 mg/dL (ref 0.0–149.0)
VLDL: 21.6 mg/dL (ref 0.0–40.0)

## 2020-07-31 LAB — HEMOGLOBIN A1C: Hgb A1c MFr Bld: 5.5 % (ref 4.6–6.5)

## 2020-07-31 LAB — VITAMIN B12: Vitamin B-12: 240 pg/mL (ref 211–911)

## 2020-07-31 LAB — FOLATE: Folate: 8.3 ng/mL (ref >5.9)

## 2020-07-31 MED ORDER — BELSOMRA 20 MG PO TABS: 1.0000 | ORAL_TABLET | Freq: Every day | ORAL | 1 refills | Status: DC

## 2020-07-31 MED ORDER — CLONAZEPAM 0.5 MG PO TABS: 0.5000 mg | ORAL_TABLET | Freq: Two times a day (BID) | ORAL | 1 refills | Status: AC | PRN

## 2020-07-31 MED ORDER — VIIBRYD STARTER PACK 10 & 20 MG PO KIT: 1.0000 | PACK | Freq: Every day | ORAL | 0 refills | Status: DC

## 2020-07-31 MED ORDER — TRAZODONE HCL 100 MG PO TABS: 200.0000 mg | ORAL_TABLET | Freq: Every evening | ORAL | 1 refills | Status: AC | PRN

## 2020-07-31 MED ORDER — LEVOCETIRIZINE DIHYDROCHLORIDE 5 MG PO TABS: 5.0000 mg | ORAL_TABLET | Freq: Every evening | ORAL | 1 refills | Status: DC

## 2020-07-31 NOTE — Patient Instructions (Signed)
Major Depressive Disorder, Adult Major depressive disorder (MDD) is a mental health condition. It may also be called clinical depression or unipolar depression. MDD usually causes feelings of sadness, hopelessness, or helplessness. MDD can also cause physical symptoms. It can interfere with work, school, relationships, and other everyday activities. MDD may be mild, moderate, or severe. It may occur once (single episode major depressive disorder) or it may occur multiple times (recurrent major depressive disorder). What are the causes? The exact cause of this condition is not known. MDD is most likely caused by a combination of things, which may include:  Genetic factors. These are traits that are passed along from parent to child.  Individual factors. Your personality, your behavior, and the way you handle your thoughts and feelings may contribute to MDD. This includes personality traits and behaviors learned from others.  Physical factors, such as: ? Differences in the part of your brain that controls emotion. This part of your brain may be different than it is in people who do not have MDD. ? Long-term (chronic) medical or psychiatric illnesses.  Social factors. Traumatic experiences or major life changes may play a role in the development of MDD. What increases the risk? This condition is more likely to develop in women. The following factors may also make you more likely to develop MDD:  A family history of depression.  Troubled family relationships.  Abnormally low levels of certain brain chemicals.  Traumatic events in childhood, especially abuse or the loss of a parent.  Being under a lot of stress, or long-term stress, especially from upsetting life experiences or losses.  A history of: ? Chronic physical illness. ? Other mental health disorders. ? Substance abuse.  Poor living conditions.  Experiencing social exclusion or discrimination on a regular basis. What are the  signs or symptoms? The main symptoms of MDD typically include:  Constant depressed or irritable mood.  Loss of interest in things and activities. MDD symptoms may also include:  Sleeping or eating too much or too little.  Unexplained weight change.  Fatigue or low energy.  Feelings of worthlessness or guilt.  Difficulty thinking clearly or making decisions.  Thoughts of suicide or of harming others.  Physical agitation or weakness.  Isolation. Severe cases of MDD may also occur with other symptoms, such as:  Delusions or hallucinations, in which you imagine things that are not real (psychotic depression).  Low-level depression that lasts at least a year (chronic depression or persistent depressive disorder).  Extreme sadness and hopelessness (melancholic depression).  Trouble speaking and moving (catatonic depression). How is this diagnosed? This condition may be diagnosed based on:  Your symptoms.  Your medical history, including your mental health history. This may involve tests to evaluate your mental health. You may be asked questions about your lifestyle, including any drug and alcohol use, and how long you have had symptoms of MDD.  A physical exam.  Blood tests to rule out other conditions. You must have a depressed mood and at least four other MDD symptoms most of the day, nearly every day in the same 2-week timeframe before your health care provider can confirm a diagnosis of MDD. How is this treated? This condition is usually treated by mental health professionals, such as psychologists, psychiatrists, and clinical social workers. You may need more than one type of treatment. Treatment may include:  Psychotherapy. This is also called talk therapy or counseling. Types of psychotherapy include: ? Cognitive behavioral therapy (CBT). This type of therapy   teaches you to recognize unhealthy feelings, thoughts, and behaviors, and replace them with positive thoughts  and actions. ? Interpersonal therapy (IPT). This helps you to improve the way you relate to and communicate with others. ? Family therapy. This treatment includes members of your family.  Medicine to treat anxiety and depression, or to help you control certain emotions and behaviors.  Lifestyle changes, such as: ? Limiting alcohol and drug use. ? Exercising regularly. ? Getting plenty of sleep. ? Making healthy eating choices. ? Spending more time outdoors.  Treatments involving stimulation of the brain can be used in situations with extremely severe symptoms, or when medicine or other therapies do not work over time. These treatments include electroconvulsive therapy, transcranial magnetic stimulation, and vagal nerve stimulation. Follow these instructions at home: Activity  Return to your normal activities as told by your health care provider.  Exercise regularly and spend time outdoors as told by your health care provider. General instructions  Take over-the-counter and prescription medicines only as told by your health care provider.  Do not drink alcohol. If you drink alcohol, limit your alcohol intake to no more than 1 drink a day for nonpregnant women and 2 drinks a day for men. One drink equals 12 oz of beer, 5 oz of wine, or 1 oz of hard liquor. Alcohol can affect any antidepressant medicines you are taking. Talk to your health care provider about your alcohol use.  Eat a healthy diet and get plenty of sleep.  Find activities that you enjoy doing, and make time to do them.  Consider joining a support group. Your health care provider may be able to recommend a support group.  Keep all follow-up visits as told by your health care provider. This is important. Where to find more information National Alliance on Mental Illness  www.nami.org [place] National Institute of Mental Health  www.nimh.nih.gov National Suicide Prevention Lifeline  1-800-273-TALK (8255). This is  free, 24-hour help. Contact a health care provider if:  Your symptoms get worse.  You develop new symptoms. Get help right away if:  You self-harm.  You have serious thoughts about hurting yourself or others.  You see, hear, taste, smell, or feel things that are not present (hallucinate). This information is not intended to replace advice given to you by your health care provider. Make sure you discuss any questions you have with your health care provider. Document Revised: 08/15/2017 Document Reviewed: 03/13/2016 Elsevier Patient Education  2020 [employer]  

## 2020-07-31 NOTE — Progress Notes (Signed)
Subjective:  Patient ID: Maria Pennington, female    DOB: 05-06-1991  Age: 29 y.o. MRN: [id number]  CC: Annual Exam  This visit occurred during the SARS-CoV-2 public health emergency.  Safety protocols were in place, including screening questions prior to the visit, additional usage of staff PPE, and extensive cleaning of exam room while observing appropriate contact time as indicated for disinfecting solutions.   HPI Maria Pennington presents for a CPX.  She continues to struggle with depression, anxiety, and insomnia.  She recently saw a psychiatrist and it was recommended that she take Ativan and bupropion.  She has not started either 1 of those.  She would rather take clonazepam.  She takes trazodone which helps some but she is not taking any other antidepressants.  She is planning to undergo bariatric surgery and needs a letter of recommendation and a preop chest x-ray.  Outpatient Medications Prior to Visit  Medication Sig Dispense Refill  . diclofenac Sodium (VOLTAREN) 1 % GEL Apply 2 g topically 4 (four) times daily. 350 g 2  . Levonorgestrel (KYLEENA) 19.5 MG IUD by Intrauterine route.    . triamcinolone ointment (KENALOG) 0.5 % Apply 1 application topically 2 (two) times daily. 60 g 2  . clonazePAM (KLONOPIN) 0.5 MG tablet TAKE 1 TABLET BY MOUTH 3 TIMES DAILY AS NEEDED FOR ANXIETY. 90 tablet 1  . Lemborexant (DAYVIGO) 5 MG TABS Take 1 tablet by mouth at bedtime as needed. 10 tablet 0  . levocetirizine (XYZAL) 5 MG tablet Take 1 tablet (5 mg total) by mouth every evening. 90 tablet 1  . traZODone (DESYREL) 100 MG tablet      No facility-administered medications prior to visit.    ROS Review of Systems  Constitutional: Positive for unexpected weight change (wt gain). Negative for appetite change, chills, diaphoresis and fatigue.  HENT: Negative.   Eyes: Negative.   Respiratory: Negative for apnea, shortness of breath and wheezing.   Cardiovascular: Negative for chest pain,  palpitations and leg swelling.  Gastrointestinal: Negative for abdominal pain, constipation, diarrhea, nausea and vomiting.  Endocrine: Negative.   Genitourinary: Negative.  Negative for difficulty urinating.  Musculoskeletal: Positive for arthralgias. Negative for joint swelling, myalgias and neck pain.  Skin: Negative for pallor and rash.  Neurological: Negative.  Negative for dizziness, weakness and light-headedness.  Hematological: Does not bruise/bleed easily.  Psychiatric/Behavioral: Positive for dysphoric mood and sleep disturbance. Negative for agitation, behavioral problems, confusion, decreased concentration, hallucinations and self-injury. The patient is nervous/anxious. The patient is not hyperactive.     Objective:  BP 124/80 (BP Location: Left Arm, Patient Position: Sitting, Cuff Size: Large)   Pulse 96   Temp 98.4 F (36.9 C) (Oral)   Ht _0  (1.702 m)   Wt 283 lb 3.2 oz (128.5 kg)   SpO2 96%   BMI 44.36 kg/m   BP Readings from Last 3 Encounters:  07/31/20 124/80  06/07/19 108/70  02/04/19 130/80    Wt Readings from Last 3 Encounters:  07/31/20 283 lb 3.2 oz (128.5 kg)  06/07/19 255 lb (115.7 kg)  02/04/19 243 lb (110.2 kg)    Physical Exam Vitals reviewed.  Constitutional:      General: She is not in acute distress.    Appearance: She is obese. She is not toxic-appearing or diaphoretic.  HENT:     Nose: Nose normal.     Mouth/Throat:     Mouth: Mucous membranes are moist.  Eyes:     General: No  scleral icterus.    Conjunctiva/sclera: Conjunctivae normal.  Cardiovascular:     Rate and Rhythm: Normal rate and regular rhythm.     Heart sounds: No murmur heard.   Pulmonary:     Effort: Pulmonary effort is normal.     Breath sounds: No stridor. No wheezing, rhonchi or rales.  Abdominal:     General: Abdomen is protuberant. Bowel sounds are normal. There is no distension.     Palpations: Abdomen is soft. There is no hepatomegaly, splenomegaly or mass.      Tenderness: There is no abdominal tenderness.  Musculoskeletal:        General: Normal range of motion.     Cervical back: Neck supple.     Right lower leg: No edema.     Left lower leg: No edema.  Lymphadenopathy:     Cervical: No cervical adenopathy.  Skin:    General: Skin is warm and dry.  Neurological:     General: No focal deficit present.     Mental Status: She is alert and oriented to person, place, and time. Mental status is at baseline.  Psychiatric:        Attention and Perception: Attention and perception normal.        Mood and Affect: Mood is anxious and depressed. Affect is flat.        Speech: Speech normal.        Behavior: Behavior normal. Behavior is cooperative.        Thought Content: Thought content normal. Thought content is not paranoid. Thought content does not include homicidal or suicidal ideation.        Cognition and Memory: Cognition normal.        Judgment: Judgment normal.     Lab Results  Component Value Date   WBC 8.6 07/31/2020   HGB 12.9 07/31/2020   HCT 39.2 07/31/2020   PLT 334.0 07/31/2020   GLUCOSE 96 07/31/2020   CHOL 171 07/31/2020   TRIG 108.0 07/31/2020   HDL 47.60 07/31/2020   LDLCALC 102 (H) 07/31/2020   ALT 14 07/31/2020   AST 14 07/31/2020   NA 138 07/31/2020   K 3.8 07/31/2020   CL 106 07/31/2020   CREATININE 0.66 07/31/2020   BUN 13 07/31/2020   CO2 26 07/31/2020   TSH 1.54 07/31/2020   INR 1.2 (H) 10/07/2016   HGBA1C 5.5 07/31/2020    CT Angio Chest W/Cm &/Or Wo Cm  Result Date: 05/14/2018 CLINICAL DATA:  29 year old female with acute shortness of breath for 3 weeks, elevated D-dimer and fatigue for 2 months. EXAM: CT ANGIOGRAPHY CHEST WITH CONTRAST TECHNIQUE: Multidetector CT imaging of the chest was performed using the standard protocol during bolus administration of intravenous contrast. Multiplanar CT image reconstructions and MIPs were obtained to evaluate the vascular anatomy. CONTRAST:  154m ISOVUE-370  IOPAMIDOL (ISOVUE-370) INJECTION 76% COMPARISON:  None. FINDINGS: Cardiovascular: The study is technically adequate. No pulmonary emboli are identified. UPPER limits normal heart size noted. No thoracic aortic aneurysm or pericardial effusion. Mediastinum/Nodes: No enlarged mediastinal, hilar, or axillary lymph nodes. Thyroid gland, trachea, and esophagus demonstrate no significant findings. Lungs/Pleura: No airspace disease, consolidation, mass, suspicious nodule, pleural effusion or pneumothorax. Upper Abdomen: No acute abnormality Musculoskeletal: No acute or suspicious bony abnormality. Review of the MIP images confirms the above findings. IMPRESSION: 1. UPPER limits normal heart size without other significant abnormality. No evidence of pulmonary emboli. Electronically Signed   By: JMargarette CanadaM.D.   On: 05/14/2018 14:52  DG Chest 2 View  Result Date: 08/01/2020 CLINICAL DATA:  Preop.  Asymptomatic. EXAM: CHEST - 2 VIEW COMPARISON:  Chest CT 05/14/2018 FINDINGS: The heart size and mediastinal contours are within normal limits. Both lungs are clear. The visualized skeletal structures are unremarkable. IMPRESSION: Negative chest. Electronically Signed   By: Monte Fantasia M.D.   On: 08/01/2020 04:55    Assessment & Plan:   Ikeisha was seen today for annual exam.  Diagnoses and all orders for this visit:  GAD (generalized anxiety disorder) -     traZODone (DESYREL) 100 MG tablet; Take 2 tablets (200 mg total) by mouth at bedtime as needed for sleep. -     clonazePAM (KLONOPIN) 0.5 MG tablet; Take 1 tablet (0.5 mg total) by mouth 2 (two) times daily as needed for anxiety.  Seasonal allergic rhinitis due to pollen -     levocetirizine (XYZAL) 5 MG tablet; Take 1 tablet (5 mg total) by mouth every evening.  Moderate episode of recurrent major depressive disorder ([place])- I recommended that she add vilazodone to the trazodone. -     traZODone (DESYREL) 100 MG tablet; Take 2 tablets (200 mg  total) by mouth at bedtime as needed for sleep. -     Vilazodone HCl (VIIBRYD STARTER PACK) 10 & 20 MG KIT; Take 1 tablet by mouth daily.  Pre-op exam- Her chest x-ray is normal. -     DG Chest 2 View; Future  Morbid obesity with BMI of 40.0-44.9, adult ([place])- Labs are negative for secondary causes or complications.  Recommendation letter written. -     CBC with Differential/Platelet; Future -     Basic metabolic panel; Future -     TSH; Future -     Hepatic function panel; Future -     Hemoglobin A1c; Future -     VITAMIN D 25 Hydroxy (Vit-D Deficiency, Fractures); Future -     VITAMIN D 25 Hydroxy (Vit-D Deficiency, Fractures) -     Hemoglobin A1c -     Hepatic function panel -     TSH -     Basic metabolic panel -     CBC with Differential/Platelet  Routine general medical examination at a health care facility- Exam completed, labs reviewed, she was not willing to get vaccinated against influenza or Covid-19, cancer screenings are up-to-date, patient education was given. -     Lipid panel; Future -     Lipid panel  B12 deficiency- Her H&H, B12, and folate levels are normal. -     Vitamin B12; Future -     Folate; Future -     Folate -     Vitamin B12  Psychophysiological insomnia -     Suvorexant (BELSOMRA) 20 MG TABS; Take 1 tablet by mouth at bedtime.   I have discontinued [employer]. I have also changed her traZODone and clonazePAM. Additionally, I am having her start on [employer], Belsomra, and Cholecalciferol. Lastly, I am having her maintain her levonorgestrel, diclofenac Sodium, triamcinolone ointment, and levocetirizine.  Meds ordered this encounter  Medications  . levocetirizine (XYZAL) 5 MG tablet    Sig: Take 1 tablet (5 mg total) by mouth every evening.    Dispense:  90 tablet    Refill:  1  . traZODone (DESYREL) 100 MG tablet    Sig: Take 2 tablets (200 mg total) by mouth at bedtime as needed for sleep.    Dispense:  180 tablet  Refill:  1  . Vilazodone HCl (VIIBRYD STARTER PACK) 10 & 20 MG KIT    Sig: Take 1 tablet by mouth daily.    Dispense:  1 kit    Refill:  0  . clonazePAM (KLONOPIN) 0.5 MG tablet    Sig: Take 1 tablet (0.5 mg total) by mouth 2 (two) times daily as needed for anxiety.    Dispense:  90 tablet    Refill:  1  . Suvorexant (BELSOMRA) 20 MG TABS    Sig: Take 1 tablet by mouth at bedtime.    Dispense:  90 tablet    Refill:  1  . Cholecalciferol 1.25 MG (50000 UT) capsule    Sig: Take 1 capsule (50,000 Units total) by mouth once a week.    Dispense:  12 capsule    Refill:  1     Follow-up: Return in about 6 months (around 01/28/2021).  Scarlette Calico, MD

## 2020-08-01 DIAGNOSIS — E559 Vitamin D deficiency, unspecified: Secondary | ICD-10-CM | POA: Insufficient documentation

## 2020-08-01 MED ORDER — CHOLECALCIFEROL 1.25 MG (50000 UT) PO CAPS: 50000.0000 [IU] | ORAL_CAPSULE | ORAL | 1 refills | Status: DC

## 2020-08-02 ENCOUNTER — Other Ambulatory Visit: Payer: Self-pay | Admitting: Internal Medicine

## 2020-08-02 ENCOUNTER — Encounter: Payer: Self-pay | Admitting: Internal Medicine

## 2020-08-02 DIAGNOSIS — F5104 Psychophysiologic insomnia: Secondary | ICD-10-CM

## 2020-08-02 MED ORDER — ESZOPICLONE 2 MG PO TABS: 2.0000 mg | ORAL_TABLET | Freq: Every evening | ORAL | 1 refills | Status: AC | PRN

## 2020-08-16 ENCOUNTER — Other Ambulatory Visit: Payer: Self-pay | Admitting: Internal Medicine

## 2020-08-16 ENCOUNTER — Encounter: Payer: Self-pay | Admitting: Internal Medicine

## 2020-08-16 DIAGNOSIS — F331 Major depressive disorder, recurrent, moderate: Secondary | ICD-10-CM

## 2020-08-16 MED ORDER — VIIBRYD 20 MG PO TABS: 1.0000 | ORAL_TABLET | Freq: Every day | ORAL | 1 refills | Status: DC

## 2020-08-17 ENCOUNTER — Other Ambulatory Visit: Payer: Self-pay | Admitting: Internal Medicine

## 2020-08-17 DIAGNOSIS — F331 Major depressive disorder, recurrent, moderate: Secondary | ICD-10-CM

## 2020-08-17 MED ORDER — VIIBRYD 20 MG PO TABS: 1.0000 | ORAL_TABLET | Freq: Every day | ORAL | 1 refills | Status: DC

## 2020-08-18 ENCOUNTER — Encounter: Payer: Self-pay | Admitting: Internal Medicine

## 2020-08-18 NOTE — Telephone Encounter (Signed)
error 

## 2020-08-18 NOTE — Telephone Encounter (Signed)
Effective from 08/18/2020 through 08/17/2021.

## 2020-08-18 NOTE — Telephone Encounter (Signed)
Patient calling stating the pharmacy sent over the form at 10:12 this morning

## 2020-08-18 NOTE — Telephone Encounter (Signed)
Fax has not been received.  I have initiated PA manually via CoverMyMeds and waiting for determination.

## 2020-09-18 ENCOUNTER — Encounter: Payer: Self-pay | Admitting: Internal Medicine

## 2020-11-06 ENCOUNTER — Encounter: Payer: Self-pay | Admitting: Internal Medicine

## 2020-11-11 ENCOUNTER — Encounter: Payer: Self-pay | Admitting: Internal Medicine

## 2020-11-16 ENCOUNTER — Encounter: Payer: Self-pay | Admitting: Internal Medicine

## 2020-11-21 ENCOUNTER — Other Ambulatory Visit: Payer: Self-pay | Admitting: Internal Medicine

## 2020-11-21 DIAGNOSIS — F411 Generalized anxiety disorder: Secondary | ICD-10-CM

## 2020-11-21 MED ORDER — ESCITALOPRAM OXALATE 5 MG PO TABS: 5.0000 mg | ORAL_TABLET | Freq: Every day | ORAL | 0 refills | Status: DC

## 2020-12-20 ENCOUNTER — Other Ambulatory Visit: Payer: Self-pay | Admitting: Internal Medicine

## 2020-12-20 ENCOUNTER — Telehealth: Payer: Self-pay | Admitting: Internal Medicine

## 2020-12-20 DIAGNOSIS — J301 Allergic rhinitis due to pollen: Secondary | ICD-10-CM

## 2020-12-20 MED ORDER — LEVOCETIRIZINE DIHYDROCHLORIDE 5 MG PO TABS: 5.0000 mg | ORAL_TABLET | Freq: Every evening | ORAL | 1 refills | Status: DC

## 2020-12-20 NOTE — Telephone Encounter (Signed)
1.Medication Requested: levocetirizine (XYZAL) 5 MG tablet    2. Pharmacy (Name, Street, [address]): [phone] - [place], [place] - [address] AT [address]  3. On Med List: yes   4. Last Visit with PCP: 11.15.21  5. Next visit date with PCP: n/a   Patient is requesting a 90 day supply.    Agent: Please be advised that RX refills may take up to 3 business days. We ask that you follow-up with your pharmacy.

## 2020-12-22 ENCOUNTER — Other Ambulatory Visit: Payer: Self-pay | Admitting: Internal Medicine

## 2020-12-22 ENCOUNTER — Encounter: Payer: Self-pay | Admitting: Internal Medicine

## 2020-12-22 DIAGNOSIS — J301 Allergic rhinitis due to pollen: Secondary | ICD-10-CM

## 2020-12-28 ENCOUNTER — Telehealth: Payer: Self-pay

## 2020-12-28 NOTE — Telephone Encounter (Signed)
PA Key: [id number]  PA for levocetirizine (xyzal) was denied.  Please Advise

## 2021-01-01 NOTE — Telephone Encounter (Signed)
PA for levocetirizine (xyzal) was denied, pt would like to appeal the decision.  Please Advise

## 2021-01-01 NOTE — Telephone Encounter (Signed)
She can get the generic option OTC at a very low cost

## 2021-01-20 ENCOUNTER — Other Ambulatory Visit: Payer: Self-pay | Admitting: Internal Medicine

## 2021-01-20 DIAGNOSIS — E559 Vitamin D deficiency, unspecified: Secondary | ICD-10-CM

## 2021-02-17 ENCOUNTER — Other Ambulatory Visit: Payer: Self-pay | Admitting: Internal Medicine

## 2021-02-17 DIAGNOSIS — F411 Generalized anxiety disorder: Secondary | ICD-10-CM

## 2022-10-07 IMAGING — DX DG CHEST 2V
2 series · 2 of 2 positions shown · non-contrast
Comparison: Chest CT 05/14/2018

CLINICAL DATA: Preop.  Asymptomatic.

EXAM:
CHEST - 2 VIEW

[chest pa]
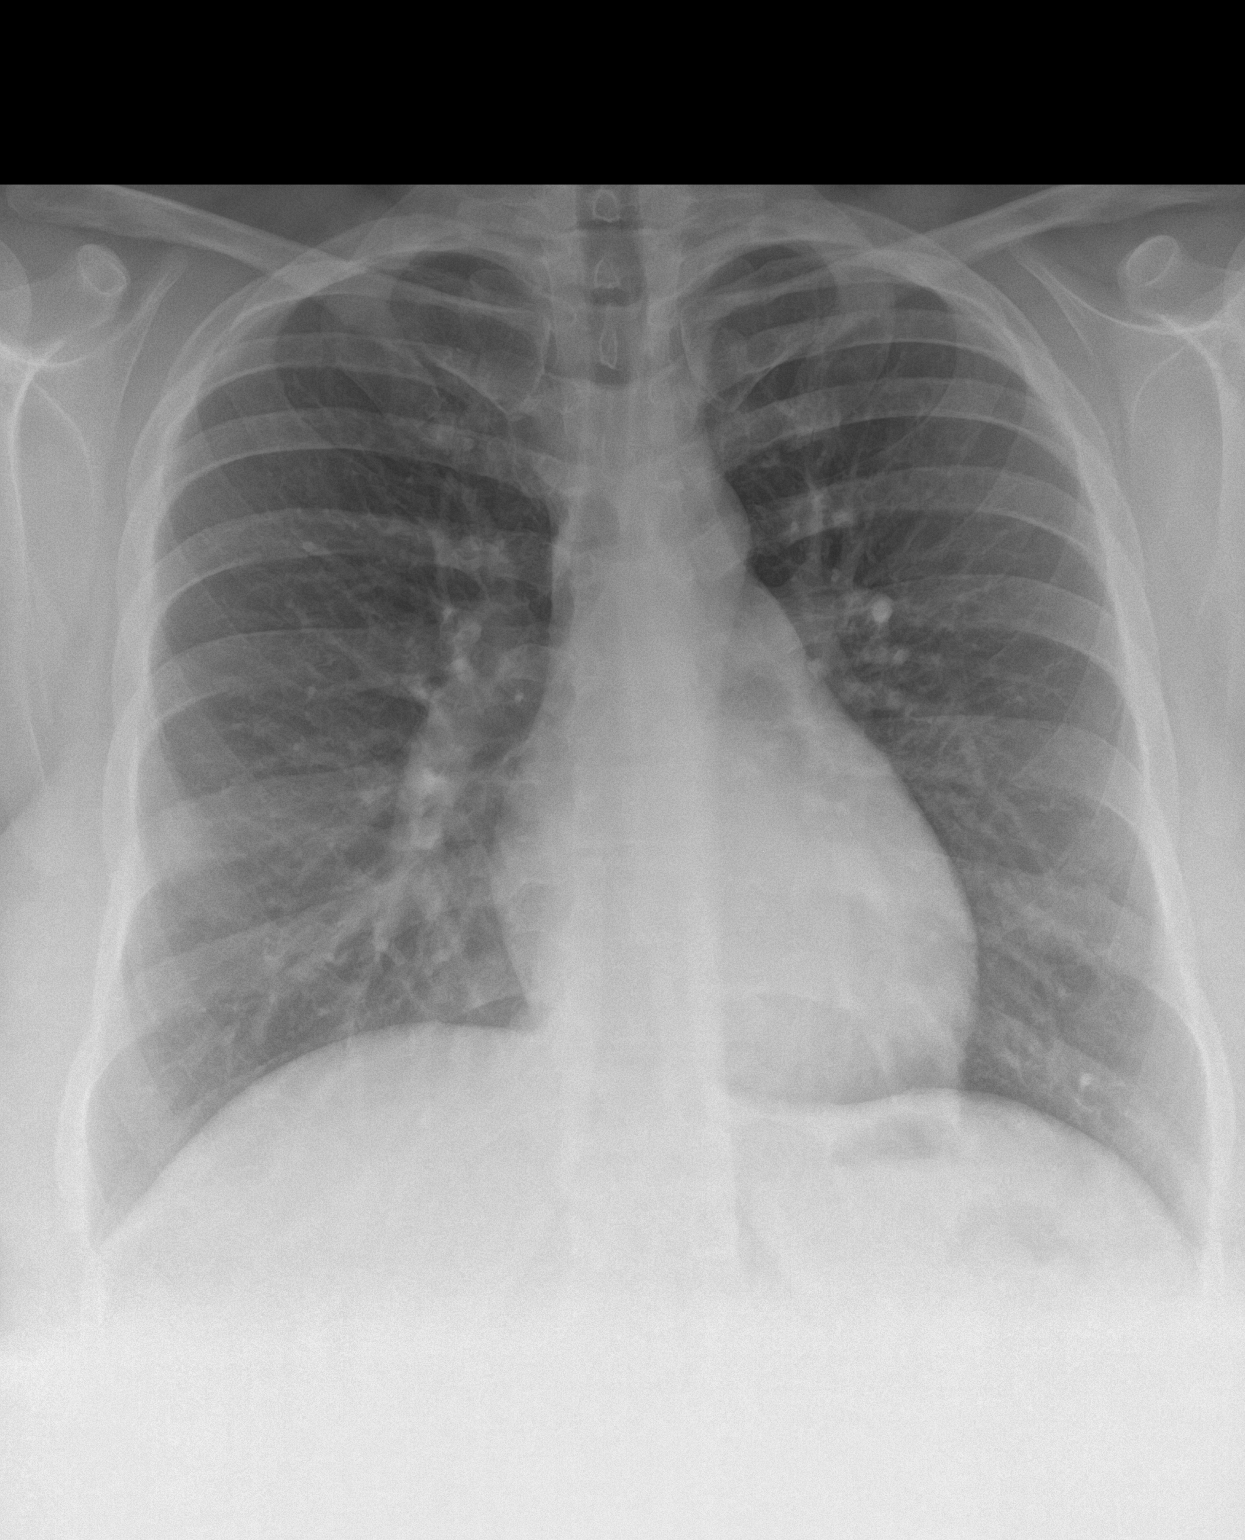

[chest lat]
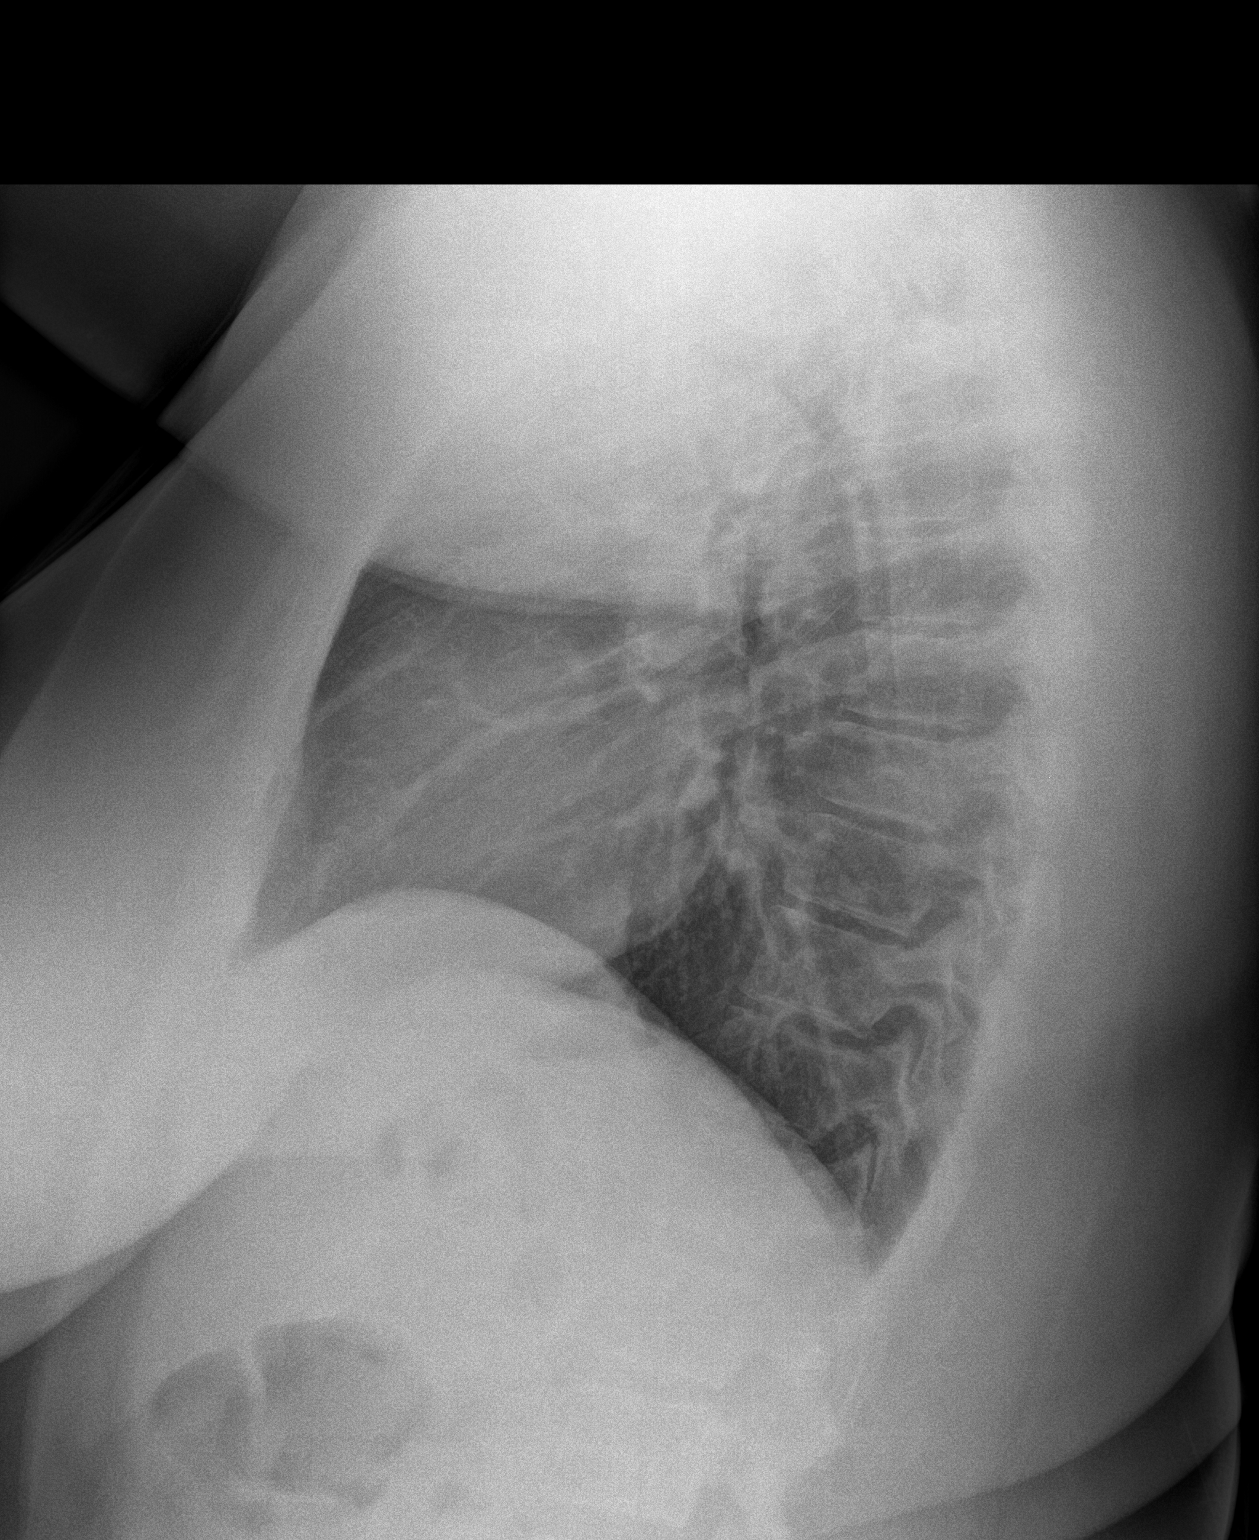

[2 of 2 positions shown; findings below may reference images not displayed]

FINDINGS: The heart size and mediastinal contours are within normal limits.
Both lungs are clear. The visualized skeletal structures are
unremarkable.
IMPRESSION: Negative chest.
